# Patient Record
Sex: Male | Born: 1955 | Race: White | Hispanic: No | Smoking: Current every day smoker
Health system: Southern US, Community
[De-identification: ages and names within clinical notes are randomized; demographics above are authoritative.]

## PROBLEM LIST (undated history)

## (undated) DIAGNOSIS — I4891 Unspecified atrial fibrillation: Secondary | ICD-10-CM

## (undated) DIAGNOSIS — J449 Chronic obstructive pulmonary disease, unspecified: Secondary | ICD-10-CM

## (undated) DIAGNOSIS — K219 Gastro-esophageal reflux disease without esophagitis: Secondary | ICD-10-CM

## (undated) DIAGNOSIS — F32A Depression, unspecified: Secondary | ICD-10-CM

## (undated) DIAGNOSIS — M4800 Spinal stenosis, site unspecified: Secondary | ICD-10-CM

## (undated) DIAGNOSIS — I10 Essential (primary) hypertension: Secondary | ICD-10-CM

## (undated) DIAGNOSIS — I714 Abdominal aortic aneurysm, without rupture, unspecified: Secondary | ICD-10-CM

## (undated) DIAGNOSIS — D649 Anemia, unspecified: Secondary | ICD-10-CM

## (undated) HISTORY — DX: Depression, unspecified: F32.A

## (undated) HISTORY — DX: Gastro-esophageal reflux disease without esophagitis: K21.9

## (undated) HISTORY — DX: Essential (primary) hypertension: I10

## (undated) HISTORY — DX: Anemia, unspecified: D64.9

## (undated) HISTORY — DX: Chronic obstructive pulmonary disease, unspecified: J44.9

---

## 2019-02-19 DIAGNOSIS — I719 Aortic aneurysm of unspecified site, without rupture: Secondary | ICD-10-CM | POA: Insufficient documentation

## 2019-04-21 DIAGNOSIS — F429 Obsessive-compulsive disorder, unspecified: Secondary | ICD-10-CM | POA: Insufficient documentation

## 2019-04-21 DIAGNOSIS — I482 Chronic atrial fibrillation, unspecified: Secondary | ICD-10-CM | POA: Insufficient documentation

## 2019-04-21 DIAGNOSIS — F1011 Alcohol abuse, in remission: Secondary | ICD-10-CM | POA: Insufficient documentation

## 2019-04-21 DIAGNOSIS — N138 Other obstructive and reflux uropathy: Secondary | ICD-10-CM | POA: Insufficient documentation

## 2019-04-21 DIAGNOSIS — K709 Alcoholic liver disease, unspecified: Secondary | ICD-10-CM | POA: Insufficient documentation

## 2019-04-21 DIAGNOSIS — W19XXXA Unspecified fall, initial encounter: Secondary | ICD-10-CM | POA: Insufficient documentation

## 2019-04-21 DIAGNOSIS — R7989 Other specified abnormal findings of blood chemistry: Secondary | ICD-10-CM | POA: Insufficient documentation

## 2019-04-21 DIAGNOSIS — I10 Essential (primary) hypertension: Secondary | ICD-10-CM | POA: Insufficient documentation

## 2019-04-21 DIAGNOSIS — E782 Mixed hyperlipidemia: Secondary | ICD-10-CM | POA: Insufficient documentation

## 2019-04-21 DIAGNOSIS — F1010 Alcohol abuse, uncomplicated: Secondary | ICD-10-CM | POA: Insufficient documentation

## 2020-09-27 DIAGNOSIS — F4310 Post-traumatic stress disorder, unspecified: Secondary | ICD-10-CM | POA: Insufficient documentation

## 2020-11-14 DIAGNOSIS — E785 Hyperlipidemia, unspecified: Secondary | ICD-10-CM | POA: Insufficient documentation

## 2020-11-14 DIAGNOSIS — F17200 Nicotine dependence, unspecified, uncomplicated: Secondary | ICD-10-CM | POA: Insufficient documentation

## 2020-11-14 DIAGNOSIS — I714 Abdominal aortic aneurysm, without rupture, unspecified: Secondary | ICD-10-CM | POA: Insufficient documentation

## 2020-11-14 DIAGNOSIS — F1020 Alcohol dependence, uncomplicated: Secondary | ICD-10-CM | POA: Insufficient documentation

## 2020-11-15 DIAGNOSIS — I472 Ventricular tachycardia, unspecified: Secondary | ICD-10-CM | POA: Insufficient documentation

## 2020-11-15 DIAGNOSIS — F32A Depression, unspecified: Secondary | ICD-10-CM | POA: Insufficient documentation

## 2020-11-18 DIAGNOSIS — G4733 Obstructive sleep apnea (adult) (pediatric): Secondary | ICD-10-CM | POA: Insufficient documentation

## 2020-11-24 DIAGNOSIS — I25119 Atherosclerotic heart disease of native coronary artery with unspecified angina pectoris: Secondary | ICD-10-CM | POA: Insufficient documentation

## 2020-11-25 DIAGNOSIS — R410 Disorientation, unspecified: Secondary | ICD-10-CM | POA: Insufficient documentation

## 2020-11-25 DIAGNOSIS — B182 Chronic viral hepatitis C: Secondary | ICD-10-CM | POA: Insufficient documentation

## 2020-12-15 DIAGNOSIS — F329 Major depressive disorder, single episode, unspecified: Secondary | ICD-10-CM | POA: Insufficient documentation

## 2020-12-15 DIAGNOSIS — F3130 Bipolar disorder, current episode depressed, mild or moderate severity, unspecified: Secondary | ICD-10-CM | POA: Insufficient documentation

## 2020-12-15 DIAGNOSIS — E119 Type 2 diabetes mellitus without complications: Secondary | ICD-10-CM | POA: Insufficient documentation

## 2020-12-16 DIAGNOSIS — M79671 Pain in right foot: Secondary | ICD-10-CM | POA: Insufficient documentation

## 2020-12-16 DIAGNOSIS — D696 Thrombocytopenia, unspecified: Secondary | ICD-10-CM | POA: Insufficient documentation

## 2021-01-09 DIAGNOSIS — Z8673 Personal history of transient ischemic attack (TIA), and cerebral infarction without residual deficits: Secondary | ICD-10-CM | POA: Insufficient documentation

## 2021-01-09 DIAGNOSIS — Z993 Dependence on wheelchair: Secondary | ICD-10-CM | POA: Insufficient documentation

## 2021-01-29 DIAGNOSIS — Z720 Tobacco use: Secondary | ICD-10-CM | POA: Insufficient documentation

## 2021-02-03 DIAGNOSIS — G8929 Other chronic pain: Secondary | ICD-10-CM | POA: Insufficient documentation

## 2021-03-04 DIAGNOSIS — E7439 Other disorders of intestinal carbohydrate absorption: Secondary | ICD-10-CM | POA: Insufficient documentation

## 2021-03-04 DIAGNOSIS — I4811 Longstanding persistent atrial fibrillation: Secondary | ICD-10-CM | POA: Insufficient documentation

## 2021-03-04 DIAGNOSIS — D649 Anemia, unspecified: Secondary | ICD-10-CM | POA: Insufficient documentation

## 2021-03-04 DIAGNOSIS — M4802 Spinal stenosis, cervical region: Secondary | ICD-10-CM | POA: Insufficient documentation

## 2021-03-04 DIAGNOSIS — Z7901 Long term (current) use of anticoagulants: Secondary | ICD-10-CM | POA: Insufficient documentation

## 2021-03-13 ENCOUNTER — Other Ambulatory Visit: Payer: Self-pay

## 2021-03-13 ENCOUNTER — Emergency Department
Admission: EM | Admit: 2021-03-13 | Discharge: 2021-03-13 | Disposition: A | Discharge status: Skilled Nursing Facility | Payer: Medicare HMO | Attending: Emergency Medicine | Admitting: Emergency Medicine

## 2021-03-13 DIAGNOSIS — M542 Cervicalgia: Secondary | ICD-10-CM | POA: Insufficient documentation

## 2021-03-13 DIAGNOSIS — Z7901 Long term (current) use of anticoagulants: Secondary | ICD-10-CM | POA: Diagnosis not present

## 2021-03-13 DIAGNOSIS — G8929 Other chronic pain: Secondary | ICD-10-CM | POA: Insufficient documentation

## 2021-03-13 MED ORDER — BUPRENORPHINE HCL-NALOXONE HCL 2-0.5 MG SL SUBL
1.0000 | SUBLINGUAL_TABLET | Freq: Once | SUBLINGUAL | Status: AC
  Administered 2021-03-13: 1 via SUBLINGUAL
  Filled 2021-03-13: qty 1

## 2021-03-13 NOTE — ED Triage Notes (Signed)
Pt to ED from [facility] for chronic pain, pt states he takes suboxone and nobody would give it to him. Pt states pain "everywhere"

## 2021-03-13 NOTE — ED Notes (Signed)
See triage note  Presents via EMS for [facility]   Per staff he is here for pain control  He was admitted to [facility] yesterday afternoon  The staff is not allow to give suboxone

## 2021-03-13 NOTE — ED Triage Notes (Signed)
Pt comes into the ED via [facility] from [facility] c/o chronic pain.  Pt states the pain is mainly in the neck and shoulders.  Pt is worsened over the past couple weeks.  Pt was on suboxone.  90/59, 107 HR, 98.1 oral

## 2021-03-13 NOTE — ED Provider Notes (Signed)
[facility] EMERGENCY DEPARTMENT Provider Note   CSN: [id number] Arrival date & time: 03/13/21  1529     History Chief Complaint  Patient presents with   Pain Management    Adrian Randall is a 65 y.o. male presents to the emergency department for evaluation of pain management.  Patient has been a long stent and a hospital, 55 days for suicidal ideations, chest pain.  He underwent stress testing.  He had chronic neck pain and was found to have severe cervical stenosis and recommended to follow-up outpatient with neurosurgery.  He recently discharged to [facility].  Discharge medications consist of Suboxone twice daily which is he has been taking daily for chronic pain.  Facility states they are unable to give him this medication so he was sent to the ER to receive this medication.  Patient's Suboxone has been prescribed long-term for patient's chronic pain.  He is currently taking gabapentin.  Patient denies any chest pain, shortness of breath, suicidal ideations.  He is having no complaints with urination and bowel movements.  HPI     History reviewed. No pertinent past medical history.  There are no problems to display for this patient.      No family history on file.     Home Medications Prior to Admission medications   Medication Sig Start Date End Date Taking? Authorizing Provider  apixaban (ELIQUIS) 5 MG TABS tablet Take 5 mg by mouth 2 (two) times daily.   Yes [provider]  atorvastatin (LIPITOR) 40 MG tablet Take 40 mg by mouth daily.   Yes [provider]  buprenorphine-naloxone (SUBOXONE) 2-0.5 mg SUBL SL tablet Place 1 tablet under the tongue daily.   Yes [provider]  cyclobenzaprine (FLEXERIL) 5 MG tablet Take 5 mg by mouth 3 (three) times daily as needed for muscle spasms.   Yes [provider]  divalproex (DEPAKOTE ER) 500 MG 24 hr tablet Take 1,000 mg by mouth daily.   Yes  [provider]  FLUoxetine (PROZAC) 20 MG tablet Take 20 mg by mouth daily.   Yes [provider]  gabapentin (NEURONTIN) 300 MG capsule Take 300 mg by mouth at bedtime.   Yes [provider]  levothyroxine (SYNTHROID) 25 MCG tablet Take 25 mcg by mouth daily before breakfast.   Yes [provider]  metoprolol succinate (TOPROL-XL) 25 MG 24 hr tablet Take 25 mg by mouth daily.   Yes [provider]  OLANZapine (ZYPREXA) 2.5 MG tablet Take 2.5 mg by mouth at bedtime.   Yes [provider]  pantoprazole (PROTONIX) 40 MG tablet Take 40 mg by mouth daily.   Yes [provider]    Allergies    Patient has no allergy information on record.  Review of Systems   Review of Systems  Constitutional:  Negative for fever.  Respiratory:  Negative for shortness of breath.   Cardiovascular:  Negative for chest pain.  Gastrointestinal:  Negative for nausea and vomiting.  Musculoskeletal:  Positive for neck pain.  Skin:  Negative for rash and wound.  Neurological:  Negative for headaches.   Physical Exam Updated Vital Signs BP (!) 114/55 (BP Location: Right Arm)   Pulse 90   Temp 98.4 F (36.9 C) (Oral)   Resp 20   Ht 5\' 11"  (1.803 m)   Wt 77.1 kg   SpO2 96%   BMI 23.71 kg/m   Physical Exam Constitutional:      Appearance: He  is well-developed. He is obese.  HENT:     Head: Normocephalic and atraumatic.  Eyes:     Conjunctiva/sclera: Conjunctivae normal.  Cardiovascular:     Rate and Rhythm: Normal rate.  Pulmonary:     Effort: Pulmonary effort is normal. No respiratory distress.  Abdominal:     General: There is no distension.     Tenderness: There is no abdominal tenderness. There is no right CVA tenderness or guarding.  Musculoskeletal:        General: No swelling or tenderness. Normal range of motion.     Cervical back: Normal range of motion.  Skin:    General: Skin is warm.     Findings: No rash.  Neurological:      Mental Status: He is alert and oriented to person, place, and time.  Psychiatric:        Behavior: Behavior normal.        Thought Content: Thought content normal.    ED Results / Procedures / Treatments   Labs (all labs ordered are listed, but only abnormal results are displayed) Labs Reviewed - No data to display  EKG None  Radiology No results found.  Procedures Procedures   Medications Ordered in ED Medications  buprenorphine-naloxone (SUBOXONE) 2-0.5 mg per SL tablet 1 tablet (1 tablet Sublingual Given 03/13/21 1855)    ED Course  I have reviewed the triage vital signs and the nursing notes.  Pertinent labs & imaging results that were available during my care of the patient were reviewed by me and considered in my medical decision making (see chart for details).    MDM Rules/Calculators/A&P                         {  65 year old male with history of chronic pain presents from [facility].  Patient has Suboxone on his medication list and they are unable to provide him this medication.  Dose of Suboxone was given today.  No withdrawal symptoms.  Pain well controlled with medication.  Recommend facility contact prescribing physician of the Suboxone discussed alternative treatment for patient if they are unable to continue with this medication.  Patient stable and ready for discharge to skilled nursing facility. Final Clinical Impression(s) / ED Diagnoses Final diagnoses:  Encounter for chronic pain management    Rx / DC Orders ED Discharge Orders     None        Ronnette Juniper 03/13/21 1931    Sharyn Creamer, MD 03/13/21 2131

## 2021-03-13 NOTE — ED Notes (Signed)
Pt states he has not eaten since breakfast, pt given sandwich tray and soda.

## 2021-03-13 NOTE — ED Notes (Signed)
Verbal consent form pt for discharge, to [facility]

## 2021-03-13 NOTE — ED Notes (Signed)
Called [facility] for transport back to [facility]

## 2021-03-14 ENCOUNTER — Emergency Department
Admission: EM | Admit: 2021-03-14 | Discharge: 2021-03-14 | Disposition: A | Discharge status: Skilled Nursing Facility | Payer: Medicare HMO | Attending: Student in an Organized Health Care Education/Training Program | Admitting: Student in an Organized Health Care Education/Training Program

## 2021-03-14 ENCOUNTER — Encounter: Payer: Self-pay | Admitting: Emergency Medicine

## 2021-03-14 DIAGNOSIS — Z0189 Encounter for other specified special examinations: Secondary | ICD-10-CM | POA: Insufficient documentation

## 2021-03-14 DIAGNOSIS — G8929 Other chronic pain: Secondary | ICD-10-CM | POA: Insufficient documentation

## 2021-03-14 DIAGNOSIS — I4891 Unspecified atrial fibrillation: Secondary | ICD-10-CM | POA: Diagnosis not present

## 2021-03-14 DIAGNOSIS — Z7901 Long term (current) use of anticoagulants: Secondary | ICD-10-CM | POA: Diagnosis not present

## 2021-03-14 DIAGNOSIS — Z79899 Other long term (current) drug therapy: Secondary | ICD-10-CM | POA: Diagnosis not present

## 2021-03-14 DIAGNOSIS — F17210 Nicotine dependence, cigarettes, uncomplicated: Secondary | ICD-10-CM | POA: Insufficient documentation

## 2021-03-14 DIAGNOSIS — M545 Low back pain, unspecified: Secondary | ICD-10-CM | POA: Diagnosis present

## 2021-03-14 HISTORY — DX: Spinal stenosis, site unspecified: M48.00

## 2021-03-14 HISTORY — DX: Unspecified atrial fibrillation: I48.91

## 2021-03-14 HISTORY — DX: Abdominal aortic aneurysm, without rupture, unspecified: I71.40

## 2021-03-14 HISTORY — DX: Abdominal aortic aneurysm, without rupture: I71.4

## 2021-03-14 MED ORDER — BUPRENORPHINE HCL-NALOXONE HCL 2-0.5 MG SL SUBL: 1.0000 | SUBLINGUAL_TABLET | Freq: Two times a day (BID) | SUBLINGUAL | 0 refills | Status: AC

## 2021-03-14 MED ORDER — BUPRENORPHINE HCL-NALOXONE HCL 2-0.5 MG SL SUBL
1.0000 | SUBLINGUAL_TABLET | Freq: Once | SUBLINGUAL | Status: AC
  Administered 2021-03-14: 1 via SUBLINGUAL
  Filled 2021-03-14: qty 1

## 2021-03-14 NOTE — ED Notes (Signed)
[facility] called back to let staff know the outside vendor has his wheelchair Zenaida Niece in the shop and they have no way of getting him back to [facility]

## 2021-03-14 NOTE — ED Notes (Addendum)
Spoke with safe transport. Reports they will be calling outside vendor for transport and will call back to confirm ride and time.

## 2021-03-14 NOTE — ED Provider Notes (Addendum)
[facility] Emergency Department Provider Note ____________________________________________  Time seen: 1319  I have reviewed the triage vital signs and the nursing notes.  HISTORY  Chief Complaint  Pain Management   HPI Adrian Randall is a 65 y.o. male to the ED via EMS with subsequent request for pain management.  Patient is currently a resident at [facility], for rehab.  He has a history of chronic back pain including spinal stenosis.  He was recently evaluated by provider at [facility], and surgery was requested.  They recommended rehab therapy for strengthening and muscle conditioning, prior to surgery.  Patient has been under the care of a pain management clinic and he has been on Suboxone for years.  He apparently presents to the ED for the second consecutive day, for administration of Suboxone.  While the patient has a current prescription, there are providers and or nursing staff at [facility], are unable to dispense the controlled substance.  He denies any recent injury, trauma, or falls.  He also denies any increased anxiety, chest pain, shortness breath, nausea, vomiting, or any other signs of withdrawal.  Past Medical History:  Diagnosis Date   A-fib (HCC)    AAA (abdominal aortic aneurysm) (HCC)    Spinal stenosis     There are no problems to display for this patient.   Prior to Admission medications   Medication Sig Start Date End Date Taking? Authorizing Provider  buprenorphine-naloxone (SUBOXONE) 2-0.5 mg SUBL SL tablet Place 1 tablet under the tongue every 12 (twelve) hours for 3 days. 03/14/21 03/17/21 Yes Argelia Formisano, Charlesetta Ivory, PA-C  apixaban (ELIQUIS) 5 MG TABS tablet Take 5 mg by mouth 2 (two) times daily.    [provider]  atorvastatin (LIPITOR) 40 MG tablet Take 40 mg by mouth daily.    [provider]  buprenorphine-naloxone (SUBOXONE) 2-0.5 mg SUBL SL tablet Place 1 tablet under the tongue daily.     [provider]  cyclobenzaprine (FLEXERIL) 5 MG tablet Take 5 mg by mouth 3 (three) times daily as needed for muscle spasms.    [provider]  divalproex (DEPAKOTE ER) 500 MG 24 hr tablet Take 1,000 mg by mouth daily.    [provider]  FLUoxetine (PROZAC) 20 MG tablet Take 20 mg by mouth daily.    [provider]  gabapentin (NEURONTIN) 300 MG capsule Take 300 mg by mouth at bedtime.    [provider]  levothyroxine (SYNTHROID) 25 MCG tablet Take 25 mcg by mouth daily before breakfast.    [provider]  metoprolol succinate (TOPROL-XL) 25 MG 24 hr tablet Take 25 mg by mouth daily.    [provider]  OLANZapine (ZYPREXA) 2.5 MG tablet Take 2.5 mg by mouth at bedtime.    [provider]  pantoprazole (PROTONIX) 40 MG tablet Take 40 mg by mouth daily.    [provider]    Allergies Patient has no known allergies.  No family history on file.  Social History Social History   Tobacco Use   Smoking status: Every Day    Types: Cigarettes    Review of Systems  Constitutional: Negative for fever. Eyes: Negative for visual changes. ENT: Negative for sore throat. Cardiovascular: Negative for chest pain. Respiratory: Negative for shortness of breath. Gastrointestinal: Negative for abdominal pain, vomiting and diarrhea. Genitourinary: Negative for dysuria. Musculoskeletal: Positive for chronic back pain. Skin: Negative for rash. Neurological: Negative for headaches, focal weakness or numbness. ____________________________________________  PHYSICAL EXAM:  VITAL SIGNS: ED Triage Vitals  Enc Vitals Group     BP 03/14/21 1314 (!) 122/59     Pulse Rate 03/14/21 1314 70     Resp 03/14/21 1314 16     Temp 03/14/21 1314 98 F (36.7 C)     Temp Source 03/14/21 1314 Oral     SpO2 03/14/21 1314 98 %     Weight 03/14/21 1228 169 lb 15.6 oz (77.1 kg)     Height 03/14/21 1228 5\' 11"  (1.803 m)     Head  Circumference --      Peak Flow --      Pain Score 03/14/21 1228 7     Pain Loc --      Pain Edu? --      Excl. in GC? --     Constitutional: Alert and oriented. Well appearing and in no distress. Head: Normocephalic and atraumatic. Eyes: Conjunctivae are normal. PER. Normal extraocular movements Cardiovascular: Normal rate, regular rhythm. Normal distal pulses. Respiratory: Normal respiratory effort. No wheezes/rales/rhonchi. Gastrointestinal: Soft and nontender. No distention. Musculoskeletal: Nontender with normal range of motion in all extremities.  Neurologic:  Normal speech and language. No gross focal neurologic deficits are appreciated. Skin:  Skin is warm, dry and intact. No rash noted. Psychiatric: Mood and affect are normal. Patient exhibits appropriate insight and judgment. ____________________________________________    {LABS (pertinent positives/negatives)  ____________________________________________  {EKG  ____________________________________________   RADIOLOGY Official radiology report(s): No results found. ____________________________________________  PROCEDURES  Buprenorphine-naloxone 2-0.5 mg SL  Procedures ____________________________________________   INITIAL IMPRESSION / ASSESSMENT AND PLAN / ED COURSE  As part of my medical decision making, I reviewed the following data within the electronic MEDICAL RECORD NUMBER Notes from prior ED visits and [place] Controlled Substance Database    Patient with subsequent ED evaluation of chronic back pain.  He presents to the ED for the second consecutive day, for administration of his prescribed Suboxone.  Patient denies any recent injury or signs of withdrawal.  Orders placed in medication as provided in the ED.  Patient will be transferred back to his facility via EMS.   [name] was evaluated in Emergency Department on 03/14/2021 for the symptoms described in the history of present illness. He was evaluated  in the context of the global COVID-19 pandemic, which necessitated consideration that the patient might be at risk for infection with the SARS-CoV-2 virus that causes COVID-19. Institutional protocols and algorithms that pertain to the evaluation of patients at risk for COVID-19 are in a state of rapid change based on information released by regulatory bodies including the CDC and federal and state organizations. These policies and algorithms were followed during the patient's care in the ED.  ----------------------------------------- 6:14 PM on 03/14/2021 ----------------------------------------- I spoke with 05/14/2021, RN Manager at [facility]: She reports that their pharmacy facility is available to feel a valid prescription for the patient's Suboxone.  A however, do not have house MD who is authorized to write prescription as of this weekend.  They have made contact with the most recent provider out of [employer] who wrote the prescription last week, he is available to write prescription, but will not be available until Monday.  The patient needs a valid prescription for Suboxone for this weekend, that can be dispensed by the facility pharmacy.  I spoke with Friday, Pharm.Paulino Door [facility]) and she confirmed that Suboxone can be written by any authorized prescriber, as long as it is for pain  management, and not for opioid use disorder or opiate withdrawal.  Patient is information with Ms. Henegan, who was appreciative of the information.  She reports however, her provider is not available to write the prescription for this weekend.  I did write a prescription for #6 Suboxone 2-0.5 mg subcu tab to bridge the patient until he is able to get the prescription from the provider on Monday.  Patient subsequently transported nonemergency back to his facility in no acute distress.  I reviewed the patient's prescription history over the last 12 months in the multi-state controlled  substances database(s) that includes [place], [place], [place], [place], [place], [place], [place], [place], [place], [place], [place], [place], [place], and [place].  Results were notable for recent RX noted.  ____________________________________________  FINAL CLINICAL IMPRESSION(S) / ED DIAGNOSES  Final diagnoses:  Encounter for chronic pain management      Karmen Stabs, Charlesetta Ivory, PA-C 03/14/21 1507    Willy Eddy, MD 03/14/21 [address] Charlesetta Ivory, PA-C 03/14/21 1818    Willy Eddy, MD 03/15/21 1501

## 2021-03-14 NOTE — ED Notes (Signed)
Donnella Bi with EMS called to report they will not be picking up patient to transport back to [facility]. [facility] transport will be called

## 2021-03-14 NOTE — ED Triage Notes (Signed)
First Nurse Note:  Arrives via [facility] from [facility] for management of pain. Seen through ED yesterday for same.

## 2021-03-14 NOTE — Discharge Instructions (Addendum)
Follow-up with your primary care provider for ongoing management.  See your neurologist for surgical intervention.

## 2021-03-25 ENCOUNTER — Emergency Department
Admission: EM | Admit: 2021-03-25 | Discharge: 2021-03-25 | Disposition: A | Discharge status: Home / Self Care | Payer: Medicare HMO | Attending: Emergency Medicine | Admitting: Emergency Medicine

## 2021-03-25 ENCOUNTER — Encounter: Payer: Self-pay | Admitting: Emergency Medicine

## 2021-03-25 ENCOUNTER — Other Ambulatory Visit: Payer: Self-pay

## 2021-03-25 DIAGNOSIS — I959 Hypotension, unspecified: Secondary | ICD-10-CM | POA: Diagnosis present

## 2021-03-25 DIAGNOSIS — I4891 Unspecified atrial fibrillation: Secondary | ICD-10-CM | POA: Insufficient documentation

## 2021-03-25 DIAGNOSIS — F17210 Nicotine dependence, cigarettes, uncomplicated: Secondary | ICD-10-CM | POA: Insufficient documentation

## 2021-03-25 DIAGNOSIS — Z7901 Long term (current) use of anticoagulants: Secondary | ICD-10-CM | POA: Diagnosis not present

## 2021-03-25 DIAGNOSIS — Z79899 Other long term (current) drug therapy: Secondary | ICD-10-CM

## 2021-03-25 LAB — BASIC METABOLIC PANEL
Anion gap: 9 (ref 5–15)
BUN: 18 mg/dL (ref 8–23)
CO2: 26 mmol/L (ref 22–32)
Calcium: 8.7 mg/dL — ABNORMAL LOW (ref 8.9–10.3)
Chloride: 101 mmol/L (ref 98–111)
Creatinine, Ser: 0.78 mg/dL (ref 0.61–1.24)
GFR, Estimated: >60 mL/min (ref >60)
Glucose, Bld: 106 mg/dL — ABNORMAL HIGH (ref 70–99)
Potassium: 4.1 mmol/L (ref 3.5–5.1)
Sodium: 136 mmol/L (ref 135–145)

## 2021-03-25 LAB — CBC
HCT: 38.2 % — ABNORMAL LOW (ref 39.0–52.0)
Hemoglobin: 13.1 g/dL (ref 13.0–17.0)
MCH: 30.7 pg (ref 26.0–34.0)
MCHC: 34.3 g/dL (ref 30.0–36.0)
MCV: 89.5 fL (ref 80.0–100.0)
Platelets: 192 10*3/uL (ref 150–400)
RBC: 4.27 MIL/uL (ref 4.22–5.81)
RDW: 13.8 % (ref 11.5–15.5)
WBC: 6.1 10*3/uL (ref 4.0–10.5)
nRBC: 0 % (ref 0.0–0.2)

## 2021-03-25 NOTE — ED Triage Notes (Signed)
Pt called from [facility] to treatment room, no response 

## 2021-03-25 NOTE — ED Triage Notes (Signed)
Pt from [facility] care with c/o low O2 and hypotension. Pt is unable to tell me why they sent him here.

## 2021-03-25 NOTE — ED Provider Notes (Signed)
[facility] Emergency Department Provider Note   ____________________________________________   Event Date/Time   First MD Initiated Contact with Patient 03/25/21 1550     (approximate)  I have reviewed the triage vital signs and the nursing notes.   HISTORY  Chief Complaint Hypotension    HPI Adrian Randall is a 65 y.o. male who presents from his long-term care facility after they found him to be hypotensive and hypoxic.  Patient denies any complaints at this time and states that he does not know why he is here.  Patient does state that he has had multiple changes made to his medications including his medications for chronic pain recently.  Patient also states that when he sits forward sometimes he has lightheadedness but this is nothing new for him.  Patient states that the symptoms have been present for the last 2-3 months patient currently denies any vision changes, tinnitus, difficulty speaking, facial droop, sore throat, chest pain, shortness of breath, abdominal pain, nausea/vomiting/diarrhea, dysuria, or weakness/numbness/paresthesias in any extremity          Past Medical History:  Diagnosis Date   A-fib [facility])    AAA (abdominal aortic aneurysm) (HCC)    Spinal stenosis     There are no problems to display for this patient.   History reviewed. No pertinent surgical history.  Prior to Admission medications   Medication Sig Start Date End Date Taking? Authorizing Provider  apixaban (ELIQUIS) 5 MG TABS tablet Take 5 mg by mouth 2 (two) times daily.    [provider]  atorvastatin (LIPITOR) 40 MG tablet Take 40 mg by mouth daily.    [provider]  buprenorphine-naloxone (SUBOXONE) 2-0.5 mg SUBL SL tablet Place 1 tablet under the tongue daily.    [provider]  cyclobenzaprine (FLEXERIL) 5 MG tablet Take 5 mg by mouth 3 (three) times daily as needed for muscle spasms.    [provider]  divalproex  (DEPAKOTE ER) 500 MG 24 hr tablet Take 1,000 mg by mouth daily.    [provider]  FLUoxetine (PROZAC) 20 MG tablet Take 20 mg by mouth daily.    [provider]  gabapentin (NEURONTIN) 300 MG capsule Take 300 mg by mouth at bedtime.    [provider]  levothyroxine (SYNTHROID) 25 MCG tablet Take 25 mcg by mouth daily before breakfast.    [provider]  metoprolol succinate (TOPROL-XL) 25 MG 24 hr tablet Take 25 mg by mouth daily.    [provider]  OLANZapine (ZYPREXA) 2.5 MG tablet Take 2.5 mg by mouth at bedtime.    [provider]  pantoprazole (PROTONIX) 40 MG tablet Take 40 mg by mouth daily.    [provider]    Allergies Patient has no known allergies.  No family history on file.  Social History Social History   Tobacco Use   Smoking status: Every Day    Types: Cigarettes    Review of Systems Constitutional: No fever/chills Eyes: No visual changes. ENT: No sore throat. Cardiovascular: Denies chest pain. Respiratory: Denies shortness of breath. Gastrointestinal: No abdominal pain.  No nausea, no vomiting.  No diarrhea. Genitourinary: Negative for dysuria. Musculoskeletal: Negative for acute arthralgias Skin: Negative for rash. Neurological: Negative for headaches, weakness/numbness/paresthesias in any extremity Psychiatric: Negative for suicidal ideation/homicidal ideation   ____________________________________________   PHYSICAL EXAM:  VITAL SIGNS: ED Triage Vitals  Enc Vitals Group     BP 03/25/21 1335 101/72  Pulse --      Resp --      Temp 03/25/21 1335 97.7 F (36.5 C)     Temp Source 03/25/21 1335 Oral     SpO2 03/25/21 1335 96 %     Weight 03/25/21 1336 169 lb 15.6 oz (77.1 kg)     Height 03/25/21 1336 5\' 11"  (1.803 m)     Head Circumference --      Peak Flow --      Pain Score 03/25/21 1336 0     Pain Loc --      Pain Edu? --      Excl. in GC? --    Constitutional: Alert  and oriented. Well appearing elderly Caucasian male in no acute distress. Eyes: Conjunctivae are normal. PERRL. Head: Atraumatic. Nose: No congestion/rhinnorhea. Mouth/Throat: Mucous membranes are moist. Neck: No stridor Cardiovascular: Grossly normal heart sounds.  Good peripheral circulation. Respiratory: Normal respiratory effort.  No retractions. Gastrointestinal: Soft and nontender. No distention. Musculoskeletal: No obvious deformities Neurologic:  Normal speech and language. No gross focal neurologic deficits are appreciated. Skin:  Skin is warm and dry. No rash noted. Psychiatric: Mood and affect are normal. Speech and behavior are normal.  ____________________________________________   LABS (all labs ordered are listed, but only abnormal results are displayed)  Labs Reviewed  BASIC METABOLIC PANEL - Abnormal; Notable for the following components:      Result Value   Glucose, Bld 106 (*)    Calcium 8.7 (*)    All other components within normal limits  CBC - Abnormal; Notable for the following components:   HCT 38.2 (*)    All other components within normal limits   ____________________________________________  EKG  ED ECG REPORT I, 03/27/21, the attending physician, personally viewed and interpreted this ECG.  Date: 03/25/2021 EKG Time: 1345 Rate: 108 Rhythm: Atrial fibrillation QRS Axis: normal Intervals: normal ST/T Wave abnormalities: normal Narrative Interpretation: Atrial fibrillation.  No evidence of acute ischemia   PROCEDURES  Procedure(s) performed (including Critical Care):  .1-3 Lead EKG Interpretation  Date/Time: 03/25/2021 4:13 PM Performed by: 03/27/2021, MD Authorized by: Merwyn Katos, MD     Interpretation: abnormal     ECG rate:  98   ECG rate assessment: normal     Rhythm: atrial fibrillation     Ectopy: none     Conduction: normal     ____________________________________________   INITIAL IMPRESSION /  ASSESSMENT AND PLAN / ED COURSE  As part of my medical decision making, I reviewed the following data within the electronic medical record, if available:  Nursing notes reviewed and incorporated, Labs reviewed, EKG interpreted, Old chart reviewed, Radiograph reviewed and Notes from prior ED visits reviewed and incorporated      Patient presents for transient hypotension and hypoxia No chest pain, shortness of breath or significant hypoxia, head/neck/back trauma, history of heart failure or cardiomyopathy. Unlikely aortic dissection, MI induced, massive pulmonary embolism, neurogenic shock, cardiogenic shock. No recent melena, BRBPR, hematemesis, emesis, PO intolerance. No fever, cough, dysuria Unlikely hypovolemia, septic shock. Workup: CBC, CMP, PT/INR, Lactate, Type and Screen, BNP. Interventions: Fluid resuscitation.  Results: No evidence of red flag symptomatology or laboratory results Very possibly related to patient's multiple pain medications as well as beta-blockade  Will encourage patient to follow-up with her primary care provider and prescriber of these medications to thoroughly review and likely change some of these medications if this hypotension recurs. The patient has been reexamined and is  ready to be discharged.  All diagnostic results have been reviewed and discussed with the patient/family.  Care plan has been outlined and the patient/family understands all current diagnoses, results, and treatment plans.  There are no new complaints, changes, or physical findings at this time.  All questions have been addressed and answered.  Patient was instructed to, and agrees to follow-up with their primary care physician as well as return to the emergency department if any new or worsening symptoms develop.     ____________________________________________   FINAL CLINICAL IMPRESSION(S) / ED DIAGNOSES  Final diagnoses:  Transient hypotension  Polypharmacy     ED Discharge  Orders     None        Note:  This document was prepared using Dragon voice recognition software and may include unintentional dictation errors.    Merwyn Katos, MD 03/25/21 [phone]

## 2021-08-19 ENCOUNTER — Emergency Department: Payer: Medicare HMO

## 2021-08-19 ENCOUNTER — Other Ambulatory Visit: Payer: Self-pay

## 2021-08-19 ENCOUNTER — Emergency Department
Admission: EM | Admit: 2021-08-19 | Discharge: 2021-08-19 | Disposition: A | Discharge status: Home / Self Care | Payer: Medicare HMO | Attending: Emergency Medicine | Admitting: Emergency Medicine

## 2021-08-19 DIAGNOSIS — I4891 Unspecified atrial fibrillation: Secondary | ICD-10-CM | POA: Diagnosis not present

## 2021-08-19 DIAGNOSIS — Z7901 Long term (current) use of anticoagulants: Secondary | ICD-10-CM | POA: Insufficient documentation

## 2021-08-19 DIAGNOSIS — R4182 Altered mental status, unspecified: Secondary | ICD-10-CM | POA: Diagnosis present

## 2021-08-19 DIAGNOSIS — R55 Syncope and collapse: Secondary | ICD-10-CM | POA: Diagnosis not present

## 2021-08-19 DIAGNOSIS — G9389 Other specified disorders of brain: Secondary | ICD-10-CM | POA: Diagnosis not present

## 2021-08-19 LAB — PROTIME-INR
INR: 1.2 (ref 0.8–1.2)
Prothrombin Time: 15.2 seconds (ref 11.4–15.2)

## 2021-08-19 LAB — CBC
HCT: 40.2 % (ref 39.0–52.0)
Hemoglobin: 13.2 g/dL (ref 13.0–17.0)
MCH: 30.7 pg (ref 26.0–34.0)
MCHC: 32.8 g/dL (ref 30.0–36.0)
MCV: 93.5 fL (ref 80.0–100.0)
Platelets: 173 10*3/uL (ref 150–400)
RBC: 4.3 MIL/uL (ref 4.22–5.81)
RDW: 15.1 % (ref 11.5–15.5)
WBC: 10.4 10*3/uL (ref 4.0–10.5)
nRBC: 0 % (ref 0.0–0.2)

## 2021-08-19 LAB — COMPREHENSIVE METABOLIC PANEL
ALT: 12 U/L (ref 0–44)
AST: 18 U/L (ref 15–41)
Albumin: 3.4 g/dL — ABNORMAL LOW (ref 3.5–5.0)
Alkaline Phosphatase: 82 U/L (ref 38–126)
Anion gap: 9 (ref 5–15)
BUN: 16 mg/dL (ref 8–23)
CO2: 27 mmol/L (ref 22–32)
Calcium: 8.8 mg/dL — ABNORMAL LOW (ref 8.9–10.3)
Chloride: 98 mmol/L (ref 98–111)
Creatinine, Ser: 0.56 mg/dL — ABNORMAL LOW (ref 0.61–1.24)
GFR, Estimated: >60 mL/min (ref >60)
Glucose, Bld: 103 mg/dL — ABNORMAL HIGH (ref 70–99)
Potassium: 4.4 mmol/L (ref 3.5–5.1)
Sodium: 134 mmol/L — ABNORMAL LOW (ref 135–145)
Total Bilirubin: 0.6 mg/dL (ref 0.3–1.2)
Total Protein: 7 g/dL (ref 6.5–8.1)

## 2021-08-19 MED ORDER — SODIUM CHLORIDE 0.9 % IV BOLUS
1000.0000 mL | Freq: Once | INTRAVENOUS | Status: AC
  Administered 2021-08-19: 1000 mL via INTRAVENOUS

## 2021-08-19 NOTE — ED Notes (Signed)
[facility] called to transport pt back to [facility]

## 2021-08-19 NOTE — ED Provider Notes (Signed)
[facility] Provider Note    Event Date/Time   First MD Initiated Contact with Patient 08/19/21 1650     (approximate)   History   Altered Mental Status   HPI  Adrian Randall is a 66 y.o. male with past medical history of atrial fibrillation on Eliquis, chronic pain on methadone, who presents after an episode of loss of consciousness.  Patient initially tells me he is here to be evaluated for whether he would be a surgical candidate for knee replacement.  However per triage note, patient had an episode where his eyes rolled back and he had shaking at his facility.  Patient does recall that he was in the wheelchair when he felt lightheaded and then seem to pass out.  When he woke he felt back to baseline.  Denies any ongoing symptoms.  He denies chest pain shortness of breath lightheadedness abdominal pain nausea vomiting.  Currently he feels back to baseline.  Patient tells me that he was previously on Suboxone for his pain but about a week ago was changed to methadone.  He is on 20 mg daily.  Not feel that he has been oversedated.    Past Medical History:  Diagnosis Date   A-fib [facility])    AAA (abdominal aortic aneurysm) (HCC)    Spinal stenosis     There are no problems to display for this patient.    Physical Exam  Triage Vital Signs: ED Triage Vitals  Enc Vitals Group     BP 08/19/21 1307 129/82     Pulse Rate 08/19/21 1307 94     Resp 08/19/21 1307 20     Temp 08/19/21 1307 99.1 F (37.3 C)     Temp Source 08/19/21 1307 Oral     SpO2 08/19/21 1307 93 %     Weight 08/19/21 1312 169 lb 15.6 oz (77.1 kg)     Height 08/19/21 1312 5\' 11"  (1.803 m)     Head Circumference --      Peak Flow --      Pain Score 08/19/21 1312 0     Pain Loc --      Pain Edu? --      Excl. in GC? --     Most recent vital signs: Vitals:   08/19/21 1704 08/19/21 1835  BP: 90/63 106/75  Pulse: 96   Resp: 13   Temp:    SpO2: 94%      General: Awake, no  distress.  CV:  Good peripheral perfusion.  Resp:  Normal effort.  Abd:  No distention.  Neuro:             Awake, Alert, Oriented x 3  Aox3, nml speech  PERRL, EOMI, face symmetric, nml tongue movement  5/5 strength in the BL upper and lower extremities  Sensation grossly intact in the BL upper and lower extremities  Finger-nose-finger intact BL  Other:     ED Results / Procedures / Treatments  Labs (all labs ordered are listed, but only abnormal results are displayed) Labs Reviewed  COMPREHENSIVE METABOLIC PANEL - Abnormal; Notable for the following components:      Result Value   Sodium 134 (*)    Glucose, Bld 103 (*)    Creatinine, Ser 0.56 (*)    Calcium 8.8 (*)    Albumin 3.4 (*)    All other components within normal limits  CBC  PROTIME-INR  CBG MONITORING, ED     EKG  Atrial  fibrillation, rate controlled, normal intervals, no acute ischemic changes   RADIOLOGY CT head reviewed, mild increase in ventricle size which could be seen from normal aging versus normal pressure hydrocephalus, agree with radiology report   PROCEDURES:  Critical Care performed: No  .1-3 Lead EKG Interpretation Performed by: Georga Hacking, MD Authorized by: Georga Hacking, MD     Interpretation: normal     ECG rate assessment: normal     Rhythm: sinus rhythm     Ectopy: none     Conduction: normal    The patient is on the cardiac monitor to evaluate for evidence of arrhythmia and/or significant heart rate changes.   MEDICATIONS ORDERED IN ED: Medications  sodium chloride 0.9 % bolus 1,000 mL (1,000 mLs Intravenous New Bag/Given 08/19/21 1718)     IMPRESSION / MDM / ASSESSMENT AND PLAN / ED COURSE  I reviewed the triage vital signs and the nursing notes.                              Differential diagnosis includes, but is not limited to, syncope, seizure, arrhythmia, oversedation secondary to methadone Patient is a 66 year old male presenting with an episode of  loss of consciousness.  I reviewed the triage note which says that he had an episode where his eyes rolled back and he had some brief shaking while at his facility.  Patient initially telling me he is here to see whether he can be evaluated for knee surgery but then when questioned further does recall the event.  He was apparently in the wheelchair when he felt somewhat lightheaded and thinks he may have passed out.  He had no preceding chest pain or dyspnea.  Currently feels back to baseline denies current lightheadedness or dizziness.  His blood pressures are somewhat borderline but on review of his prior records does tend to run low.  Of note patient was recently changed from Suboxone to methadone.  On EKG he is in rate controlled A. fib which is normal for him.  No prolongation of the QT to suggest that this could have been from torsades related to the methadone.  CBC and BMP are reassuring.  CT head was obtained which shows no acute process.  Ventricle size is somewhat large radiology comments this is could be due to increased atrophy versus NPH.  Patient has no change in his gait or any memory difficulty, does have urinary incontinence though which he says has been going on for several months.  Clinically his presentation does not seem to be related to NPH.  Will refer to neurology.  Patient also has a history of ethanol abuse so I wonder whether he just has increased atrophy related to this.  He was given a liter of fluids.  Blood pressure remained stable he did not have any other episodes on the ED.  Given he is back to baseline without any complaints I feel he is stable for discharge.      FINAL CLINICAL IMPRESSION(S) / ED DIAGNOSES   Final diagnoses:  Syncope, unspecified syncope type  Cerebral ventriculomegaly     Rx / DC Orders   ED Discharge Orders     None        Note:  This document was prepared using Dragon voice recognition software and may include unintentional dictation  errors.   Georga Hacking, MD 08/19/21 [phone]

## 2021-08-19 NOTE — Discharge Instructions (Addendum)
You likely had a syncopal episode.  Your blood work was reassuring today.  On your CAT scan your ventricles were somewhat enlarged, this may be from aging or could be due to something called normal pressure hydrocephalus.  You may follow-up with a neurologist regarding this.

## 2021-08-19 NOTE — ED Triage Notes (Signed)
PT here via [facility] from [facility] with [facility]. BP at facility was 89/54, 116/64 with ems. Normally A&Ox4 but at 0800 this morning pt was found in his wheelchair behaving altered. Pt has periods where his eyes roll back in his head with a minor shake. Pt takes methadone, hx of etoh and a fib. Denies fall, on blood thinner. Pt in NAD in triage.  99-temp 163-cbg 94% 114/79 100 25 35-end tidal

## 2021-08-27 ENCOUNTER — Other Ambulatory Visit: Payer: Self-pay | Admitting: Neurology

## 2021-08-27 DIAGNOSIS — R4189 Other symptoms and signs involving cognitive functions and awareness: Secondary | ICD-10-CM

## 2021-08-27 DIAGNOSIS — R569 Unspecified convulsions: Secondary | ICD-10-CM

## 2021-08-27 NOTE — Progress Notes (Signed)
Referral to outpatient neurology placed for ED f/u.  Bing Neighbors, MD [facility] [phone]  If 7pm- 7am, please page neurology on call as listed in AMION.
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
## 2021-11-12 ENCOUNTER — Ambulatory Visit: Payer: Medicare HMO | Attending: Pain Medicine | Admitting: Pain Medicine

## 2021-11-12 ENCOUNTER — Encounter: Payer: Self-pay | Admitting: Pain Medicine

## 2021-11-12 VITALS — BP 108/52 | HR 108 | Temp 97.1°F | Resp 18 | Ht 71.0 in | Wt 270.0 lb

## 2021-11-12 DIAGNOSIS — M79604 Pain in right leg: Secondary | ICD-10-CM

## 2021-11-12 DIAGNOSIS — Z7901 Long term (current) use of anticoagulants: Secondary | ICD-10-CM | POA: Insufficient documentation

## 2021-11-12 DIAGNOSIS — M19011 Primary osteoarthritis, right shoulder: Secondary | ICD-10-CM | POA: Diagnosis present

## 2021-11-12 DIAGNOSIS — M19012 Primary osteoarthritis, left shoulder: Secondary | ICD-10-CM | POA: Insufficient documentation

## 2021-11-12 DIAGNOSIS — M5382 Other specified dorsopathies, cervical region: Secondary | ICD-10-CM | POA: Insufficient documentation

## 2021-11-12 DIAGNOSIS — M79605 Pain in left leg: Secondary | ICD-10-CM | POA: Insufficient documentation

## 2021-11-12 DIAGNOSIS — M5030 Other cervical disc degeneration, unspecified cervical region: Secondary | ICD-10-CM | POA: Diagnosis present

## 2021-11-12 DIAGNOSIS — M899 Disorder of bone, unspecified: Secondary | ICD-10-CM | POA: Diagnosis present

## 2021-11-12 DIAGNOSIS — M79602 Pain in left arm: Secondary | ICD-10-CM | POA: Diagnosis present

## 2021-11-12 DIAGNOSIS — M25562 Pain in left knee: Secondary | ICD-10-CM | POA: Insufficient documentation

## 2021-11-12 DIAGNOSIS — M25512 Pain in left shoulder: Secondary | ICD-10-CM | POA: Diagnosis present

## 2021-11-12 DIAGNOSIS — M542 Cervicalgia: Secondary | ICD-10-CM | POA: Insufficient documentation

## 2021-11-12 DIAGNOSIS — R937 Abnormal findings on diagnostic imaging of other parts of musculoskeletal system: Secondary | ICD-10-CM | POA: Insufficient documentation

## 2021-11-12 DIAGNOSIS — M25511 Pain in right shoulder: Secondary | ICD-10-CM | POA: Diagnosis present

## 2021-11-12 DIAGNOSIS — G894 Chronic pain syndrome: Secondary | ICD-10-CM | POA: Insufficient documentation

## 2021-11-12 DIAGNOSIS — Z789 Other specified health status: Secondary | ICD-10-CM | POA: Insufficient documentation

## 2021-11-12 DIAGNOSIS — M5442 Lumbago with sciatica, left side: Secondary | ICD-10-CM | POA: Diagnosis present

## 2021-11-12 DIAGNOSIS — M25611 Stiffness of right shoulder, not elsewhere classified: Secondary | ICD-10-CM | POA: Insufficient documentation

## 2021-11-12 DIAGNOSIS — M5441 Lumbago with sciatica, right side: Secondary | ICD-10-CM | POA: Diagnosis present

## 2021-11-12 DIAGNOSIS — G8929 Other chronic pain: Secondary | ICD-10-CM | POA: Diagnosis present

## 2021-11-12 DIAGNOSIS — M79601 Pain in right arm: Secondary | ICD-10-CM | POA: Diagnosis present

## 2021-11-12 DIAGNOSIS — Z79899 Other long term (current) drug therapy: Secondary | ICD-10-CM | POA: Diagnosis present

## 2021-11-12 DIAGNOSIS — R700 Elevated erythrocyte sedimentation rate: Secondary | ICD-10-CM | POA: Diagnosis present

## 2021-11-12 DIAGNOSIS — R7982 Elevated C-reactive protein (CRP): Secondary | ICD-10-CM | POA: Insufficient documentation

## 2021-11-12 DIAGNOSIS — M170 Bilateral primary osteoarthritis of knee: Secondary | ICD-10-CM | POA: Insufficient documentation

## 2021-11-12 DIAGNOSIS — M4802 Spinal stenosis, cervical region: Secondary | ICD-10-CM | POA: Diagnosis present

## 2021-11-12 DIAGNOSIS — M25561 Pain in right knee: Secondary | ICD-10-CM | POA: Insufficient documentation

## 2021-11-12 DIAGNOSIS — Z008 Encounter for other general examination: Secondary | ICD-10-CM | POA: Insufficient documentation

## 2021-11-12 NOTE — Progress Notes (Signed)
Safety precautions to be maintained throughout the outpatient stay will include: orient to surroundings, keep bed in low position, maintain call bell within reach at all times, provide assistance with transfer out of bed and ambulation.  

## 2021-11-12 NOTE — Patient Instructions (Addendum)
Get your x-rays done as soon as possible. ?Stop Eliquis 3 days before procedure. ?______________________________________________________________________ ? ?Preparing for your procedure (without sedation) ? ?Procedure appointments are limited to planned procedures: ?No Prescription Refills. ?No disability issues will be discussed. ?No medication changes will be discussed. ? ?Instructions: ?Oral Intake: Do not eat or drink anything for at least 6 hours prior to your procedure. (Exception: Blood Pressure Medication. See below.) ?Transportation: Unless otherwise stated by your physician, you may drive yourself after the procedure. ?Blood Pressure Medicine: Do not forget to take your blood pressure medicine with a sip of water the morning of the procedure. If your Diastolic (lower reading)is above 100 mmHg, elective cases will be cancelled/rescheduled. ?Blood thinners: These will need to be stopped for procedures. Notify our staff if you are taking any blood thinners. Depending on which one you take, there will be specific instructions on how and when to stop it. ?Diabetics on insulin: Notify the staff so that you can be scheduled 1st case in the morning. If your diabetes requires high dose insulin, take only ? of your normal insulin dose the morning of the procedure and notify the staff that you have done so. ?Preventing infections: Shower with an antibacterial soap the morning of your procedure.  ?Build-up your immune system: Take 1000 mg of Vitamin C with every meal (3 times a day) the day prior to your procedure. ?Antibiotics: Inform the staff if you have a condition or reason that requires you to take antibiotics before dental procedures. ?Pregnancy: If you are pregnant, call and cancel the procedure. ?Sickness: If you have a cold, fever, or any active infections, call and cancel the procedure. ?Arrival: You must be in the facility at least 30 minutes prior to your scheduled procedure. ?Children: Do not bring any  children with you. ?Dress appropriately: Bring dark clothing that you would not mind if they get stained. ?Valuables: Do not bring any jewelry or valuables. ? ?Reasons to call and reschedule or cancel your procedure: (Following these recommendations will minimize the risk of a serious complication.) ?Surgeries: Avoid having procedures within 2 weeks of any surgery. (Avoid for 2 weeks before or after any surgery). ?Flu Shots: Avoid having procedures within 2 weeks of a flu shots or . (Avoid for 2 weeks before or after immunizations). ?Barium: Avoid having a procedure within 7-10 days after having had a radiological study involving the use of radiological contrast. (Myelograms, Barium swallow or enema study). ?Heart attacks: Avoid any elective procedures or surgeries for the initial 6 months after a "Myocardial Infarction" (Heart Attack). ?Blood thinners: It is imperative that you stop these medications before procedures. Let us know if you if you take any blood thinner.  ?Infection: Avoid procedures during or within two weeks of an infection (including chest colds or gastrointestinal problems). Symptoms associated with infections include: Localized redness, fever, chills, night sweats or profuse sweating, burning sensation when voiding, cough, congestion, stuffiness, runny nose, sore throat, diarrhea, nausea, vomiting, cold or Flu symptoms, recent or current infections. It is specially important if the infection is over the area that we intend to treat. ?Heart and lung problems: Symptoms that may suggest an active cardiopulmonary problem include: cough, chest pain, breathing difficulties or shortness of breath, dizziness, ankle swelling, uncontrolled high or unusually low blood pressure, and/or palpitations. If you are experiencing any of these symptoms, cancel your procedure and contact your primary care physician for an evaluation. ? ?Remember:  ?Regular Business hours are:  ?Monday to Thursday 8:00 AM to  4:00  PM ? ?Provider's Schedule: ?Delano Metz, MD:  ?Procedure days: Tuesday and Thursday 7:30 AM to 4:00 PM ? ?Edward Jolly, MD:  ?Procedure days: Monday and Wednesday 7:30 AM to 4:00 PM ?______________________________________________________________________ ?  ?

## 2021-11-13 DIAGNOSIS — R700 Elevated erythrocyte sedimentation rate: Secondary | ICD-10-CM | POA: Insufficient documentation

## 2021-11-13 DIAGNOSIS — R7982 Elevated C-reactive protein (CRP): Secondary | ICD-10-CM | POA: Insufficient documentation

## 2021-11-17 LAB — 25-HYDROXY VITAMIN D LCMS D2+D3
25-Hydroxy, Vitamin D-2: <1 ng/mL
25-Hydroxy, Vitamin D-3: 16 ng/mL
25-Hydroxy, Vitamin D: 16 ng/mL — ABNORMAL LOW

## 2021-11-17 LAB — COMP. METABOLIC PANEL (12)
AST: 15 IU/L (ref 0–40)
Albumin/Globulin Ratio: 1.4 (ref 1.2–2.2)
Albumin: 3.6 g/dL — ABNORMAL LOW (ref 3.8–4.8)
Alkaline Phosphatase: 99 IU/L (ref 44–121)
BUN/Creatinine Ratio: 27 — ABNORMAL HIGH (ref 10–24)
BUN: 18 mg/dL (ref 8–27)
Bilirubin Total: 0.3 mg/dL (ref 0.0–1.2)
Calcium: 8.8 mg/dL (ref 8.6–10.2)
Chloride: 101 mmol/L (ref 96–106)
Creatinine, Ser: 0.66 mg/dL — ABNORMAL LOW (ref 0.76–1.27)
Globulin, Total: 2.5 g/dL (ref 1.5–4.5)
Glucose: 89 mg/dL (ref 70–99)
Potassium: 4.8 mmol/L (ref 3.5–5.2)
Sodium: 141 mmol/L (ref 134–144)
Total Protein: 6.1 g/dL (ref 6.0–8.5)
eGFR: 103 mL/min/{1.73_m2} (ref >59)

## 2021-11-17 LAB — SEDIMENTATION RATE: Sed Rate: 31 mm/hr — ABNORMAL HIGH (ref 0–30)

## 2021-11-17 LAB — MAGNESIUM: Magnesium: 1.8 mg/dL (ref 1.6–2.3)

## 2021-11-17 LAB — C-REACTIVE PROTEIN: CRP: 11 mg/L — ABNORMAL HIGH (ref 0–10)

## 2021-11-17 LAB — VITAMIN B12: Vitamin B-12: 311 pg/mL (ref 232–1245)

## 2021-11-18 LAB — COMPLIANCE DRUG ANALYSIS, UR

## 2021-11-24 ENCOUNTER — Ambulatory Visit
Admission: RE | Admit: 2021-11-24 | Discharge: 2021-11-24 | Disposition: A | Discharge status: Home / Self Care | Payer: Medicare HMO | Attending: Pain Medicine | Admitting: Pain Medicine

## 2021-11-24 ENCOUNTER — Ambulatory Visit
Admission: RE | Admit: 2021-11-24 | Discharge: 2021-11-24 | Disposition: A | Discharge status: Home / Self Care | Payer: Medicare HMO | Source: Ambulatory Visit | Attending: Pain Medicine | Admitting: Pain Medicine

## 2021-11-24 DIAGNOSIS — M5442 Lumbago with sciatica, left side: Secondary | ICD-10-CM | POA: Insufficient documentation

## 2021-11-24 DIAGNOSIS — G8929 Other chronic pain: Secondary | ICD-10-CM | POA: Insufficient documentation

## 2021-11-24 DIAGNOSIS — M5441 Lumbago with sciatica, right side: Secondary | ICD-10-CM | POA: Insufficient documentation

## 2021-11-24 DIAGNOSIS — M79604 Pain in right leg: Secondary | ICD-10-CM | POA: Diagnosis present

## 2021-11-24 DIAGNOSIS — M79605 Pain in left leg: Secondary | ICD-10-CM | POA: Insufficient documentation

## 2021-11-24 DIAGNOSIS — M170 Bilateral primary osteoarthritis of knee: Secondary | ICD-10-CM | POA: Insufficient documentation

## 2022-01-28 DIAGNOSIS — Z23 Encounter for immunization: Secondary | ICD-10-CM

## 2022-02-12 ENCOUNTER — Encounter: Payer: Self-pay | Admitting: Pain Medicine

## 2022-02-12 DIAGNOSIS — Z79891 Long term (current) use of opiate analgesic: Secondary | ICD-10-CM | POA: Insufficient documentation

## 2022-02-12 DIAGNOSIS — Z79899 Other long term (current) drug therapy: Secondary | ICD-10-CM | POA: Insufficient documentation

## 2022-02-12 DIAGNOSIS — F1190 Opioid use, unspecified, uncomplicated: Secondary | ICD-10-CM | POA: Insufficient documentation

## 2022-07-18 IMAGING — CT CT HEAD W/O CM
4 series · 17 of 47 positions shown, 19 images · non-contrast
Comparison: None.

CLINICAL DATA: Mental status change, unknown cause

EXAM:
CT HEAD WITHOUT CONTRAST
TECHNIQUE: Contiguous axial images were obtained from the base of the skull
through the vertex without intravenous contrast.

[Series 2: head bone · axial · 0.44mm/px · z∈[-143,-87]mm · 4 of 83 slices shown]
[im 9/83  bone]
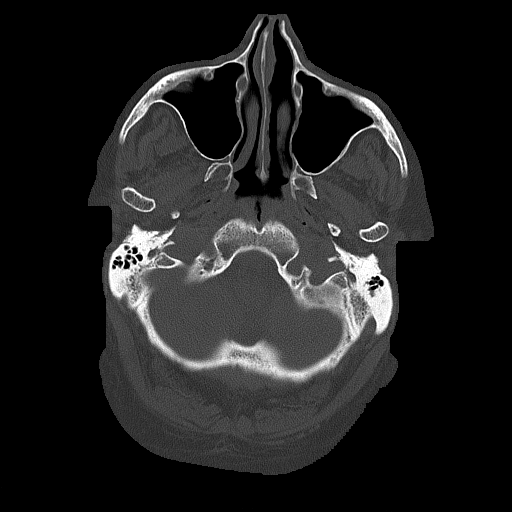
[im 17/83  bone]
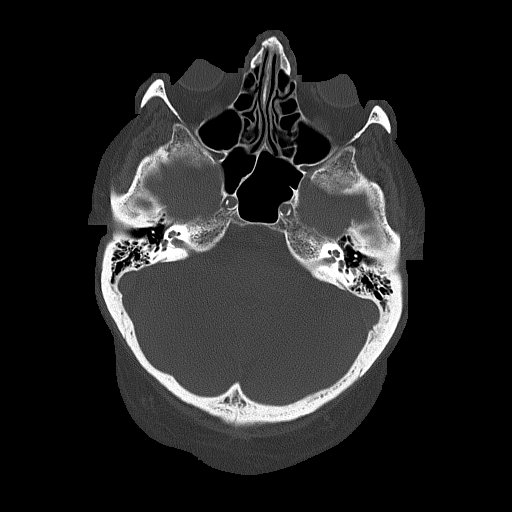
[im 25/83  bone]
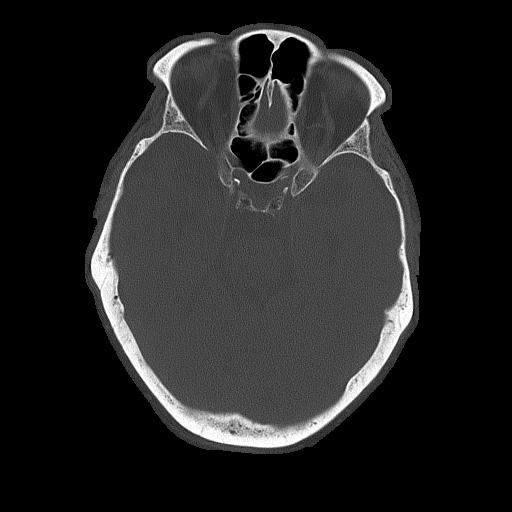
[im 37/83  bone]
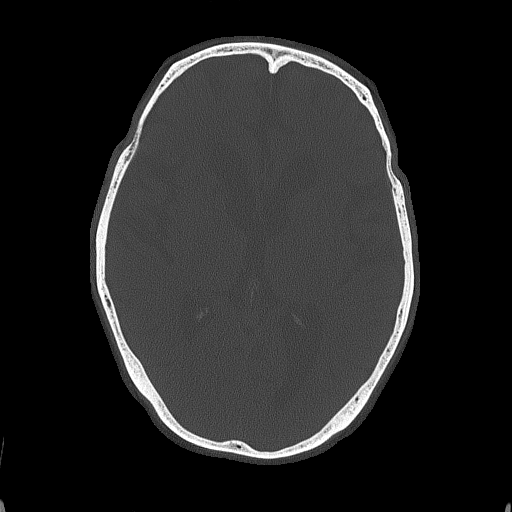

[Series 3: head wo · axial · 0.44mm/px · z∈[-139,-19]mm · 7 of 33 slices shown, 9 images]
[im 5/33  brain]
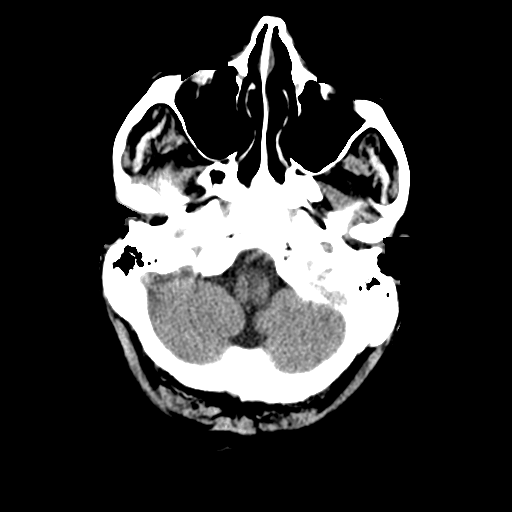
[im 5/33  bone]
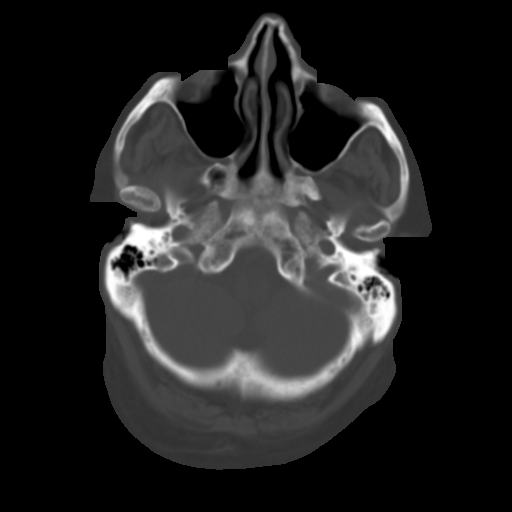
[im 9/33  brain]
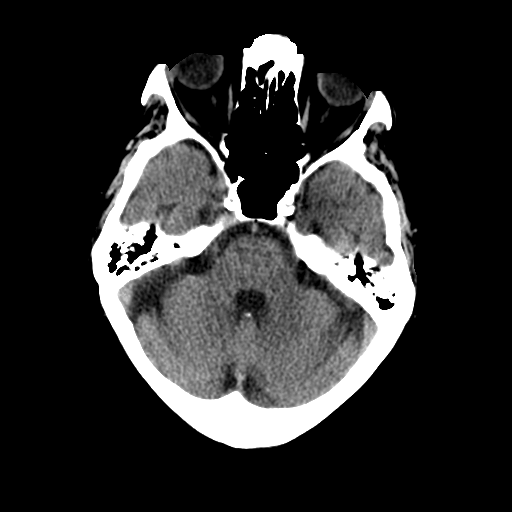
[im 13/33  brain]
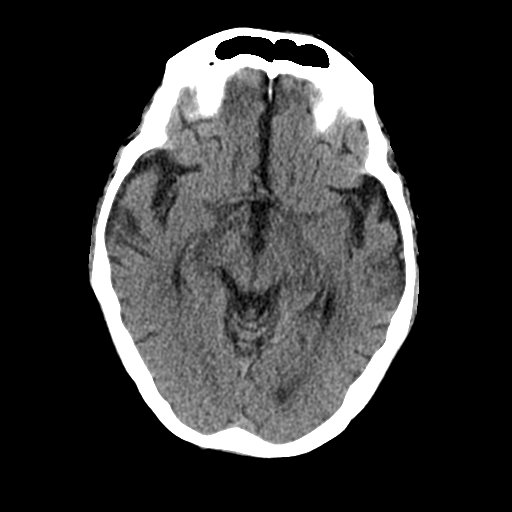
[im 17/33  brain]
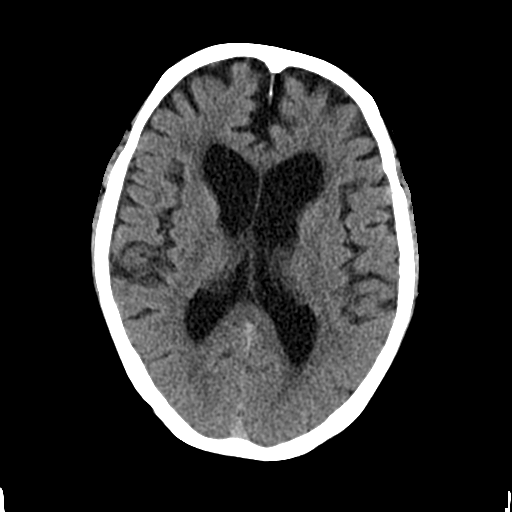
[im 21/33  brain]
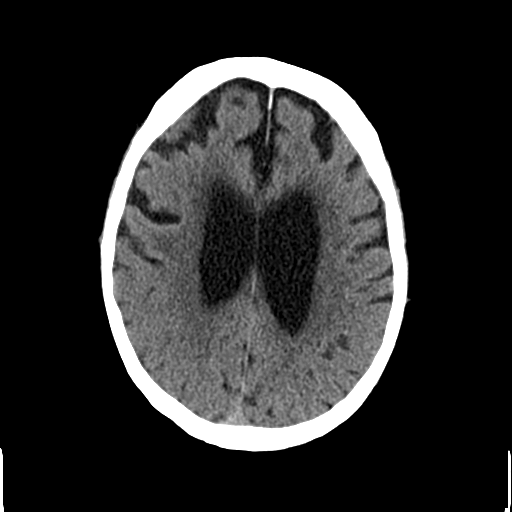
[im 21/33  bone]
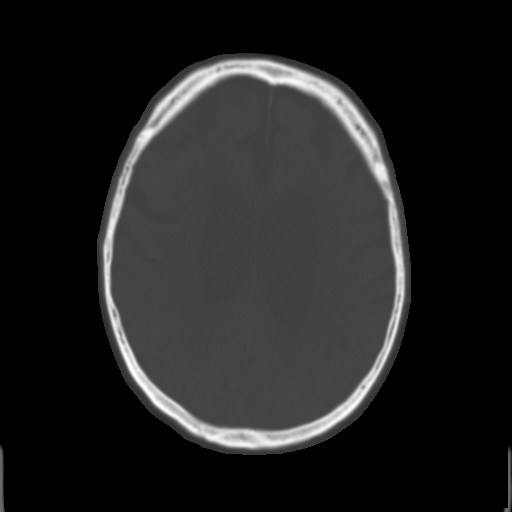
[im 25/33  brain]
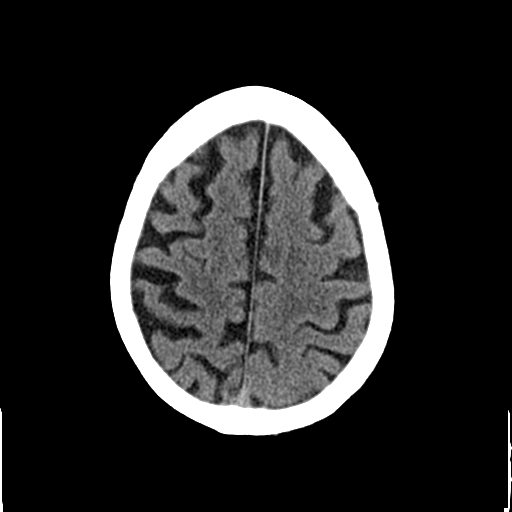
[im 29/33  brain]
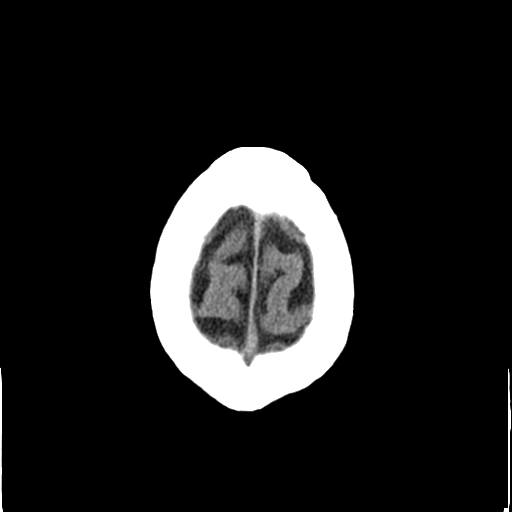

[Series 4: coronal soft tissue · coronal · 0.36mm/px · 3 of 70 slices shown]
[im 24/70  brain]
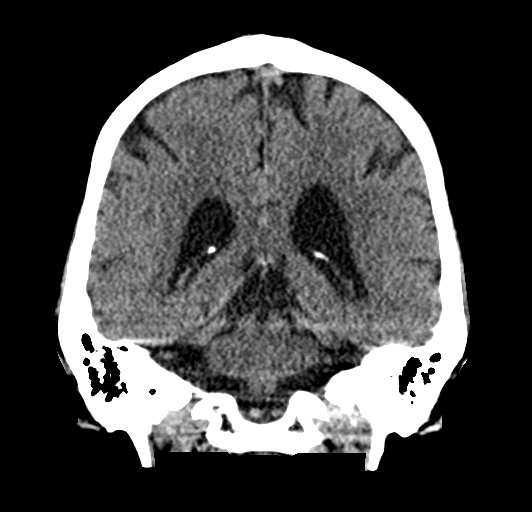
[im 31/70  brain]
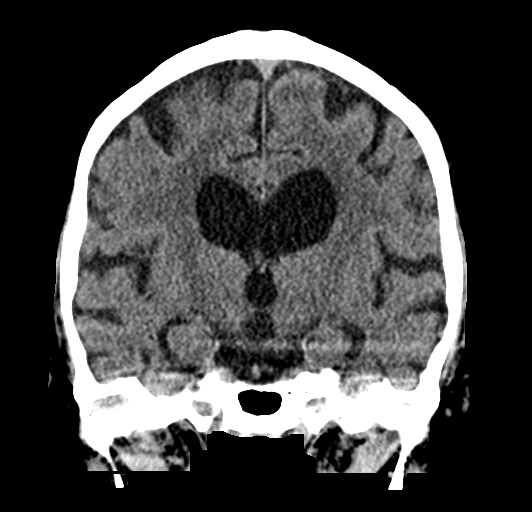
[im 39/70  brain]
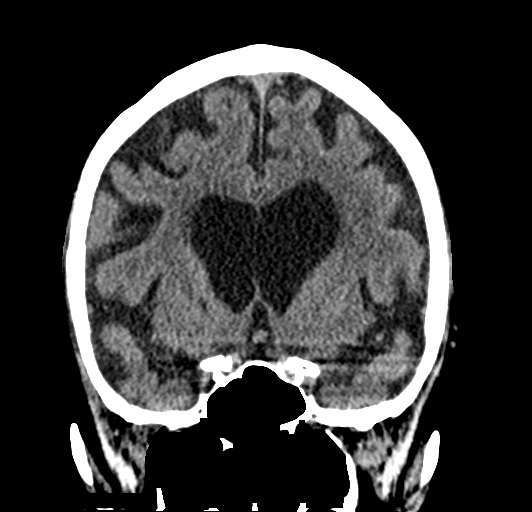

[Series 5: sagittal soft tissue · sagittal · 0.36mm/px · 3 of 64 slices shown]
[im 22/64  brain]
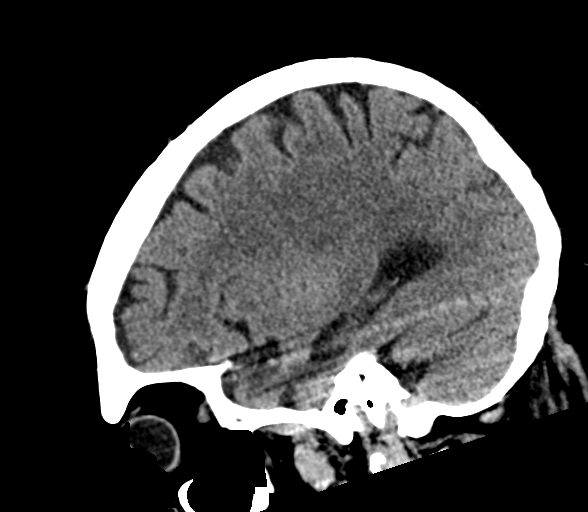
[im 32/64  brain]
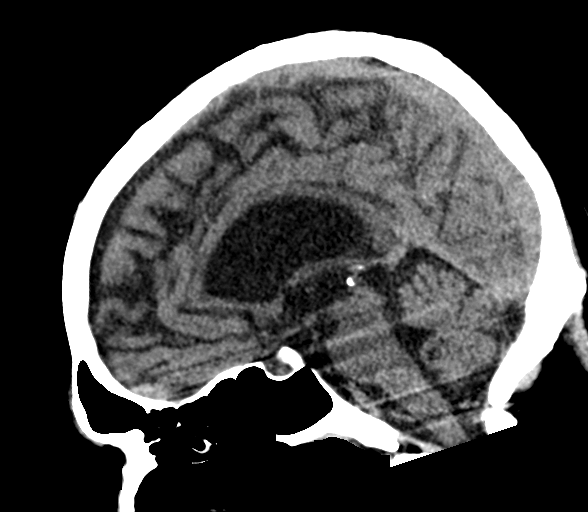
[im 43/64  brain]
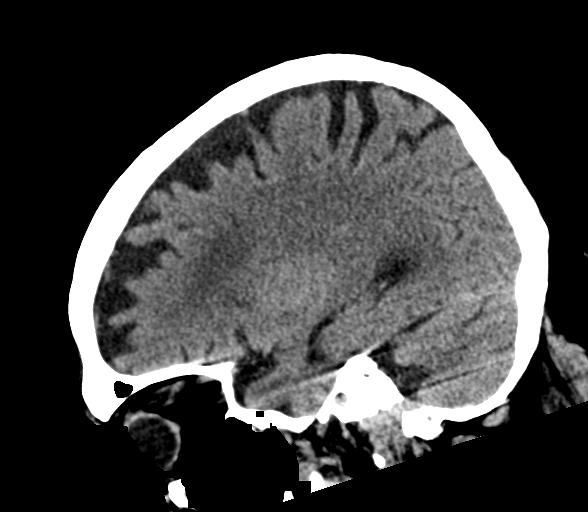

[17 of 47 positions shown; findings below may reference images not displayed]

FINDINGS: Brain: No evidence of acute infarction, hemorrhage, cerebral edema,
mass, mass effect, or midline shift. No extra-axial fluid
collection. Ventriculomegaly with somewhat dilated sylvian fissures.
No definite crowding at the vertex.

Vascular: No hyperdense vessel.

Skull: Normal. Negative for fracture or focal lesion.

Sinuses/Orbits: No acute finding.

Other: The mastoid air cells are well aerated.
IMPRESSION: IMPRESSION
1.  No acute intracranial process.

2. Ventriculomegaly, which may indicate greater than expected
central volume loss for age, but can also be seen in the setting of
normal pressure hydrocephalus, which is a clinical diagnosis.
Correlate with symptoms.

## 2022-07-28 ENCOUNTER — Other Ambulatory Visit: Payer: Self-pay

## 2022-07-28 DIAGNOSIS — Z1211 Encounter for screening for malignant neoplasm of colon: Secondary | ICD-10-CM

## 2022-07-28 MED ORDER — PEG 3350-KCL-NABCB-NACL-NASULF 236 G PO SOLR
4000.0000 mL | Freq: Once | ORAL | 0 refills | Status: AC
Start: 2022-07-28 — End: 2022-07-28

## 2022-07-28 MED ORDER — PEG 3350-KCL-NA BICARB-NACL 420 G PO SOLR: 4000.0000 mL | Freq: Once | ORAL | 0 refills | Status: DC

## 2022-07-28 NOTE — Progress Notes (Unsigned)
Spoke with Charm Rings at [phone] with [facility] to schedule patient  Gastroenterology Pre-Procedure Review  Request Date: 09/14/2022 Requesting Physician: Dr. Allegra Lai  PATIENT REVIEW QUESTIONS: The patient responded to the following health history questions as indicated:    1. Are you having any GI issues? no 2. Do you have a personal history of Polyps? no 3. Do you have a family history of Colon Cancer or Polyps? no 4. Diabetes Mellitus? yes Is not currently taking medications 5. Joint replacements in the past 12 months?no 6. Major health problems in the past 3 months?no 7. Any artificial heart valves, MVP, or defibrillator?no    MEDICATIONS & ALLERGIES:    Patient reports the following regarding taking any anticoagulation/antiplatelet therapy:   Plavix, Coumadin, Eliquis, Xarelto, Lovenox, Pradaxa, Brilinta, or Effient? yes (Eliquis) Aspirin? no  Patient confirms/reports the following medications:  @CMED @ with [facility]  Patient confirms/reports the following allergies:  @ALLERGY @ Verlon Au with [facility]  @ORDERSENC @  AUTHORIZATION INFORMATION Primary Insurance:UHC Medicare  1D#: Group #:  Secondary Insurance: 1D#:Medicaid [place] Group #:  SCHEDULE INFORMATION: Date: 09/14/2022 Time: Location:[facility] with Dr Verlon Au

## 2022-08-14 ENCOUNTER — Telehealth: Payer: Self-pay

## 2022-08-14 NOTE — Telephone Encounter (Signed)
Zenovia Jordan from [facility] called to schedule patient for his colonoscopy.  Informed her that she scheduled his colonoscopy already.  Scheduled with [facility] 07/28/23.  I provided her the date of his procedure and faxed the instructions to her at fax # [phone].  Thanks,  [place], [place]

## 2022-09-14 ENCOUNTER — Encounter: Admission: RE | Payer: Self-pay | Source: Home / Self Care

## 2022-09-14 ENCOUNTER — Encounter: Payer: Self-pay | Admitting: Anesthesiology

## 2022-09-14 ENCOUNTER — Ambulatory Visit: Admission: RE | Admit: 2022-09-14 | Payer: Medicare Other | Source: Home / Self Care | Admitting: Gastroenterology

## 2022-09-14 SURGERY — COLONOSCOPY WITH PROPOFOL
Anesthesia: General

## 2022-09-14 NOTE — Anesthesia Preprocedure Evaluation (Signed)
Anesthesia Evaluation  Patient identified by MRN, date of birth, ID band Patient awake    Reviewed: Allergy & Precautions, H&P , NPO status , Patient's Chart, lab work & pertinent test results, reviewed documented beta blocker date and time   Airway Mallampati: II   Neck ROM: full    Dental  (+) Poor Dentition   Pulmonary sleep apnea and Continuous Positive Airway Pressure Ventilation , COPD, Current Smoker   Pulmonary exam normal        Cardiovascular Exercise Tolerance: Good hypertension, On Medications + CAD  Normal cardiovascular exam Rhythm:regular Rate:Normal     Neuro/Psych   Anxiety Depression Bipolar Disorder    Neuromuscular disease  negative psych ROS   GI/Hepatic ,GERD  Medicated,,(+) Hepatitis -  Endo/Other  diabetes    Renal/GU negative Renal ROS  negative genitourinary   Musculoskeletal   Abdominal   Peds  Hematology  (+) Blood dyscrasia, anemia   Anesthesia Other Findings Past Medical History: No date: A-fib ([place]) No date: AAA (abdominal aortic aneurysm) ([facility]) No date: Anemia No date: COPD (chronic obstructive pulmonary disease) ([facility]) No date: Depression No date: GERD (gastroesophageal reflux disease) No date: Hypertension No date: Spinal stenosis No past surgical history on file.   Reproductive/Obstetrics negative OB ROS                             Anesthesia Physical Anesthesia Plan  ASA: 3  Anesthesia Plan: General   Post-op Pain Management:    Induction:   PONV Risk Score and Plan:   Airway Management Planned:   Additional Equipment:   Intra-op Plan:   Post-operative Plan:   Informed Consent: I have reviewed the patients History and Physical, chart, labs and discussed the procedure including the risks, benefits and alternatives for the proposed anesthesia with the patient or authorized representative who has indicated his/her understanding and  acceptance.     Dental Advisory Given  Plan Discussed with: CRNA  Anesthesia Plan Comments:        Anesthesia Quick Evaluation

## 2022-09-30 ENCOUNTER — Telehealth: Payer: Self-pay | Admitting: Gastroenterology

## 2022-09-30 NOTE — Telephone Encounter (Signed)
Pts aide Particia Jasper called to reschedule colonoscopy would like a call back [phone]

## 2022-09-30 NOTE — Telephone Encounter (Signed)
Returned phone call to Adrian Randall to schedule patients colonoscopy.  He has been scheduled 2 other times for his colonoscopy w/Dr. Marius Ditch. Pt is on Eliquis blood thinner.  Since he is on Eliquis I've informed Magda Paganini that I will call him back to schedule his colonoscopy after we have received Blood Thinner Advice from his physician.  Magda Paganini stated that patient Eliquis is prescribed by Dr. Kellie Simmering at [facility].  Fax # [phone]. Office # [phone]  Thanks, Sharyn Lull, [place]

## 2022-10-01 ENCOUNTER — Other Ambulatory Visit: Payer: Self-pay

## 2022-10-01 DIAGNOSIS — Z1211 Encounter for screening for malignant neoplasm of colon: Secondary | ICD-10-CM

## 2022-10-01 NOTE — Progress Notes (Signed)
Adrian Randall at [facility] called office to request the colonoscopy date so that patients pcp would be able to advise of the stop date.  Informed leslie that I will schedule patient for his /colonoscopy on 10/15/22 at [facility], [place], [place]

## 2022-10-15 ENCOUNTER — Ambulatory Visit
Admission: RE | Admit: 2022-10-15 | Discharge: 2022-10-15 | Disposition: A | Discharge status: Skilled Nursing Facility | Payer: Medicare Other | Attending: Gastroenterology | Admitting: Gastroenterology

## 2022-10-15 ENCOUNTER — Ambulatory Visit: Payer: Medicare Other | Admitting: Registered Nurse

## 2022-10-15 ENCOUNTER — Encounter
Admission: RE | Disposition: A | Discharge status: Skilled Nursing Facility | Payer: Self-pay | Source: Home / Self Care | Attending: Gastroenterology

## 2022-10-15 DIAGNOSIS — Z538 Procedure and treatment not carried out for other reasons: Secondary | ICD-10-CM | POA: Insufficient documentation

## 2022-10-15 DIAGNOSIS — Z1211 Encounter for screening for malignant neoplasm of colon: Secondary | ICD-10-CM | POA: Insufficient documentation

## 2022-10-15 HISTORY — PX: COLONOSCOPY WITH PROPOFOL: SHX5780

## 2022-10-15 SURGERY — COLONOSCOPY WITH PROPOFOL
Anesthesia: General

## 2022-10-15 MED ORDER — SODIUM CHLORIDE 0.9 % IV SOLN: INTRAVENOUS | Status: DC

## 2022-10-15 NOTE — Progress Notes (Signed)
Pt colonoscopy cancelled. Took Eliquis evening before procedure.  Was drinking water in lobby and preop area. Pt not clean, stool was soft to liquid, brown in color.  Will reschedule patient.

## 2022-10-16 ENCOUNTER — Encounter: Payer: Self-pay | Admitting: Gastroenterology

## 2022-10-19 ENCOUNTER — Telehealth: Payer: Self-pay | Admitting: Gastroenterology

## 2022-10-19 NOTE — Telephone Encounter (Signed)
Return [place] call and left a message for call back

## 2022-10-19 NOTE — Telephone Encounter (Signed)
Adrian Randall calling from [employer] [employer] requesting call back [phone]

## 2022-10-20 NOTE — Telephone Encounter (Signed)
Contacted Jasmine at [facility] and I was transferred to her voicemail.  Detailed voice message was left with her in regards to rescheduling patients colonoscopy per Dr. Marius Ditch, "This is the second time we had to reschedule patient's colonoscopy due to not following instructions.  If patient is not alert and oriented as reported by the nursing home staff member, patient's competency in making medical decision on his own needs to be addressed by his primary care provider and a legal guardian or healthcare power of attorney needs to be determined. Also, his PCP can consider noninvasive screening modalities for colon cancer screening based on his overall medical condition I do not recommend to reschedule this patient's colonoscopy until these issues have been addressed."  Thanks, Sharyn Lull, CMA

## 2022-10-20 NOTE — Telephone Encounter (Signed)
This is the second time we had to reschedule patient's colonoscopy due to not following instructions.  If patient is not alert and oriented as reported by the nursing home staff member, patient's competency in making medical decision on his own needs to be addressed by his primary care provider and a legal guardian or healthcare power of attorney needs to be determined. Also, his PCP can consider noninvasive screening modalities for colon cancer screening based on his overall medical condition I do not recommend to reschedule this patient's colonoscopy until these issues have been addressed  Sherri Sear, MD

## 2022-10-20 NOTE — Telephone Encounter (Signed)
Jasmine called this morning and I answered the phone she started stating she has been calling multiply times during the last 3 days and no one has called her back. Informed her the first call I received about this was yesterday at 3:47 and I returned the call at 3:54pm. She states she never received a message from our office. Informed her I call [phone] and the front desk transfer me to her voicemail. She asked if colonoscopy was done. Informed her no because endo nurse Kristen documented   Pt colonoscopy cancelled. Took Eliquis evening before procedure.  Was drinking water in lobby and preop area. Pt not clean, stool was soft to liquid, brown in color.  Will reschedule patient    She states why was this not sent in a letter and sent back with the patient. Informed them she would have to talk to the ENDO unit to find out why. She asked if we were going to reschedule the procedure. Informed her I would transfer her to [place] and she could talk to her about getting the procedure rescheduled. Transferred the call to [facility]

## 2022-10-20 NOTE — Telephone Encounter (Signed)
I spoke with Adrian Randall this morning in regards to rescheduling Adrian Randall's colonoscopy.  Adrian Randall asked how did we know the patient took his Eliquis the evening before procedure.  I said because the physician will ask the patient.  Adrian Randall replied that [name] is not alert and oriented enough to know if he has taken his medications or not.  I responded that if this is the case patient may need a legal guardian to make decisions for him moving forward. I also informed her that Eliquis stop date was supposed to be provided by Dr. Kellie Simmering at [facility], which I faxed the request to [facility] on 10/01/22 and it was never received back however it was discussed with Adrian Randall when I rescheduled the colonoscopy. And although colonoscopy instructions were reviewed with staff member and mailed to [facility] he was still not completely clean.   Please re-advise on rescheduling patients colonoscopy.  Thanks, [place], [place]

## 2022-10-23 IMAGING — CR DG KNEE COMPLETE 4+V*R*
1 series · 4 of 4 positions shown · non-contrast
Comparison: None.

CLINICAL DATA: Right knee pain with arthralgia for 50 years, and
notes pain has increased in the last month.

EXAM:
RIGHT KNEE - COMPLETE 4+ VIEW

[Series 1: dg knee complete 4 views right · 0.14mm/px · 4 of 4 slices shown]
[im 1/4]
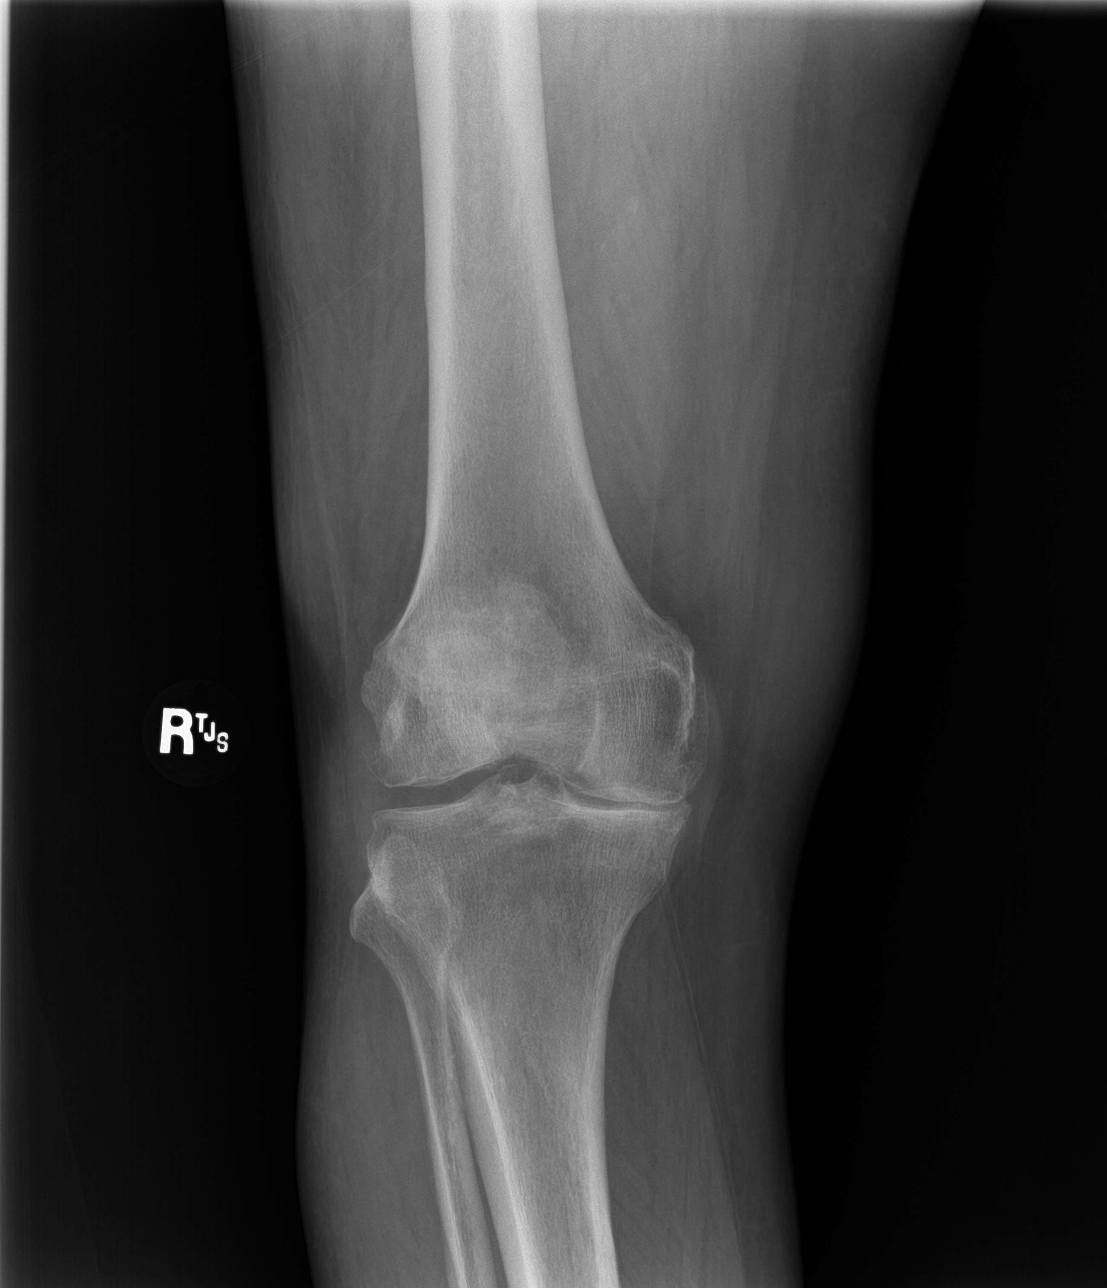
[im 2/4]
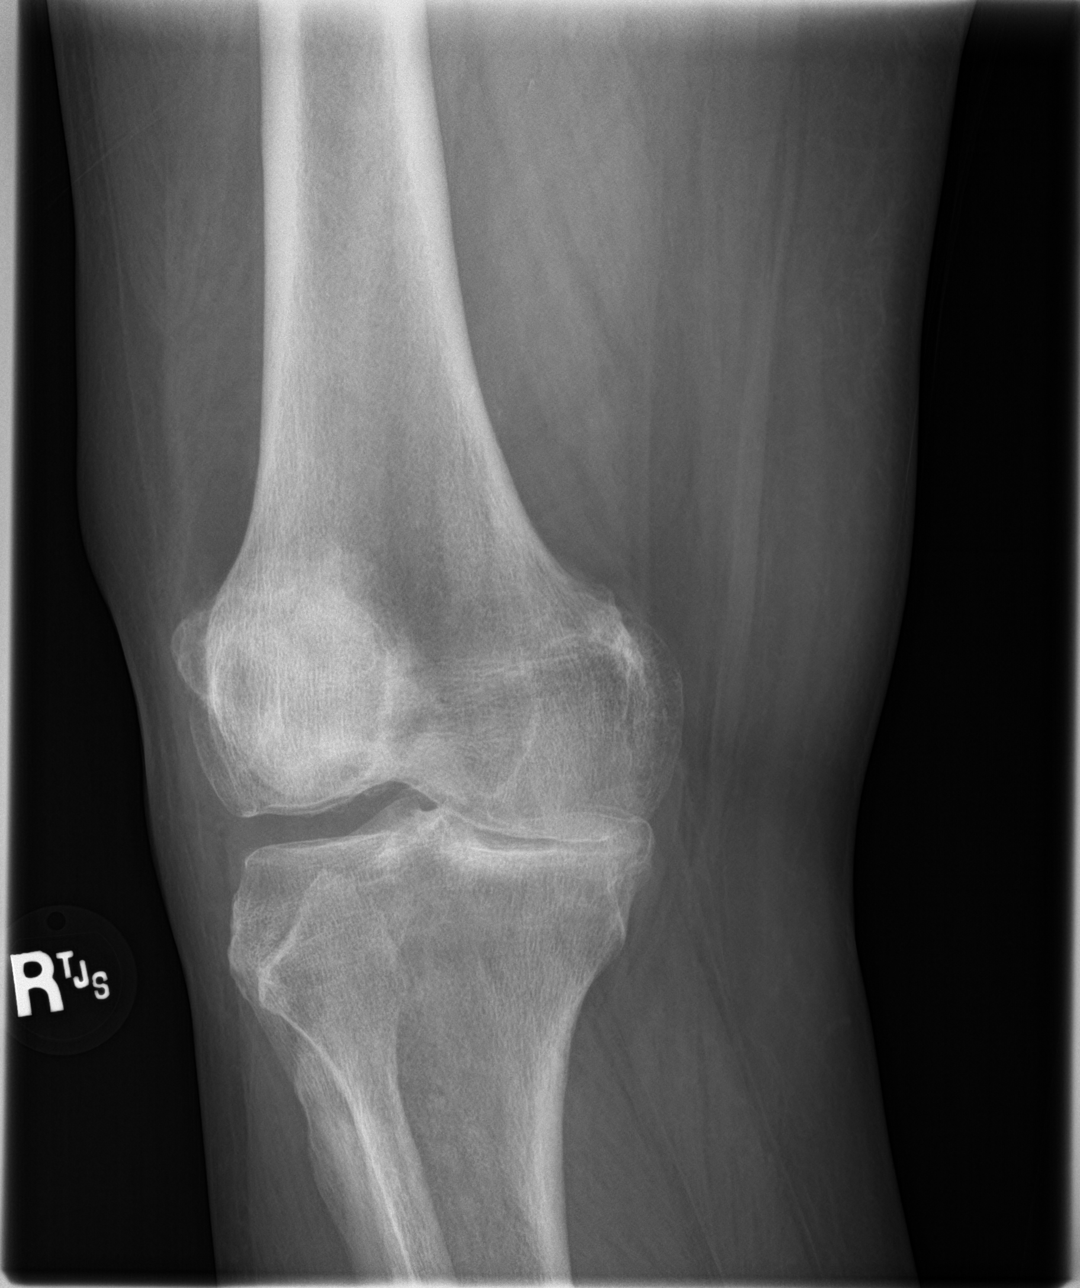
[im 3/4]
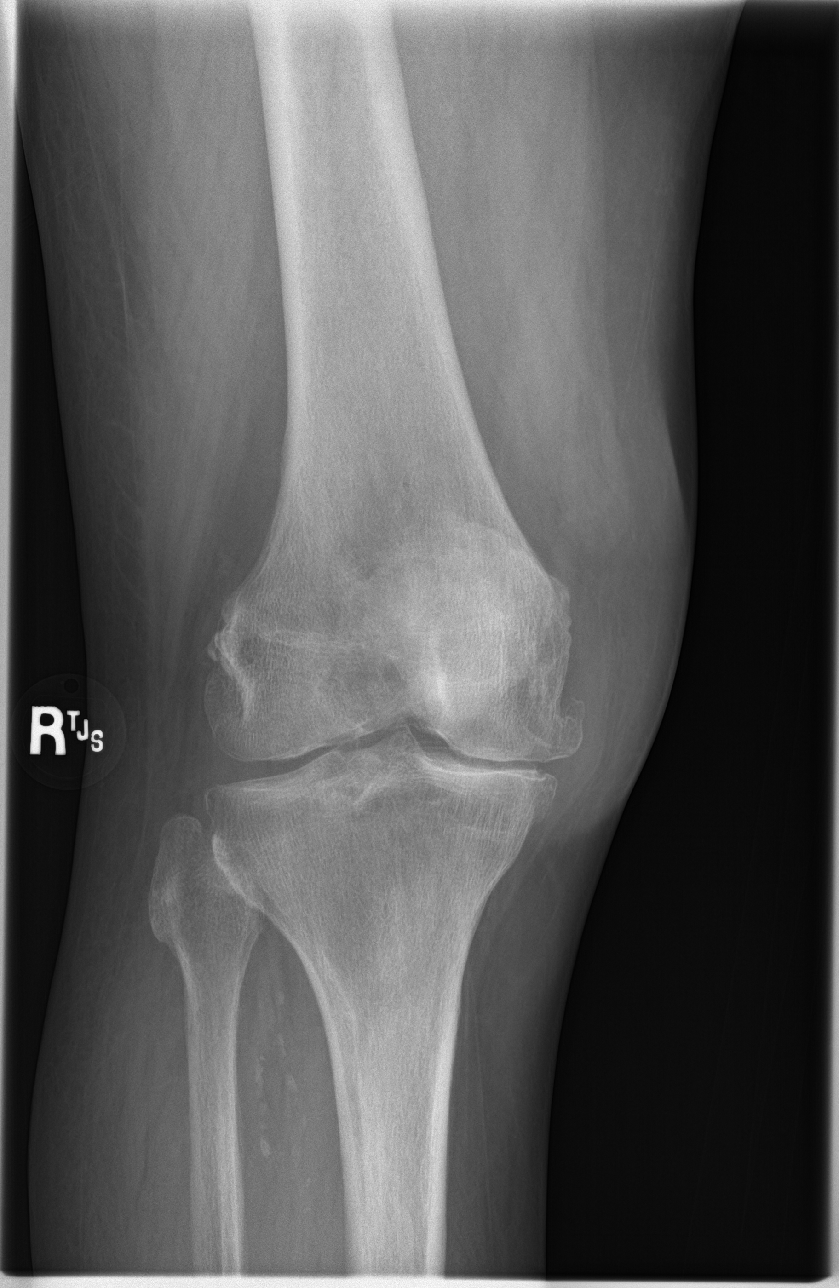
[im 4/4]
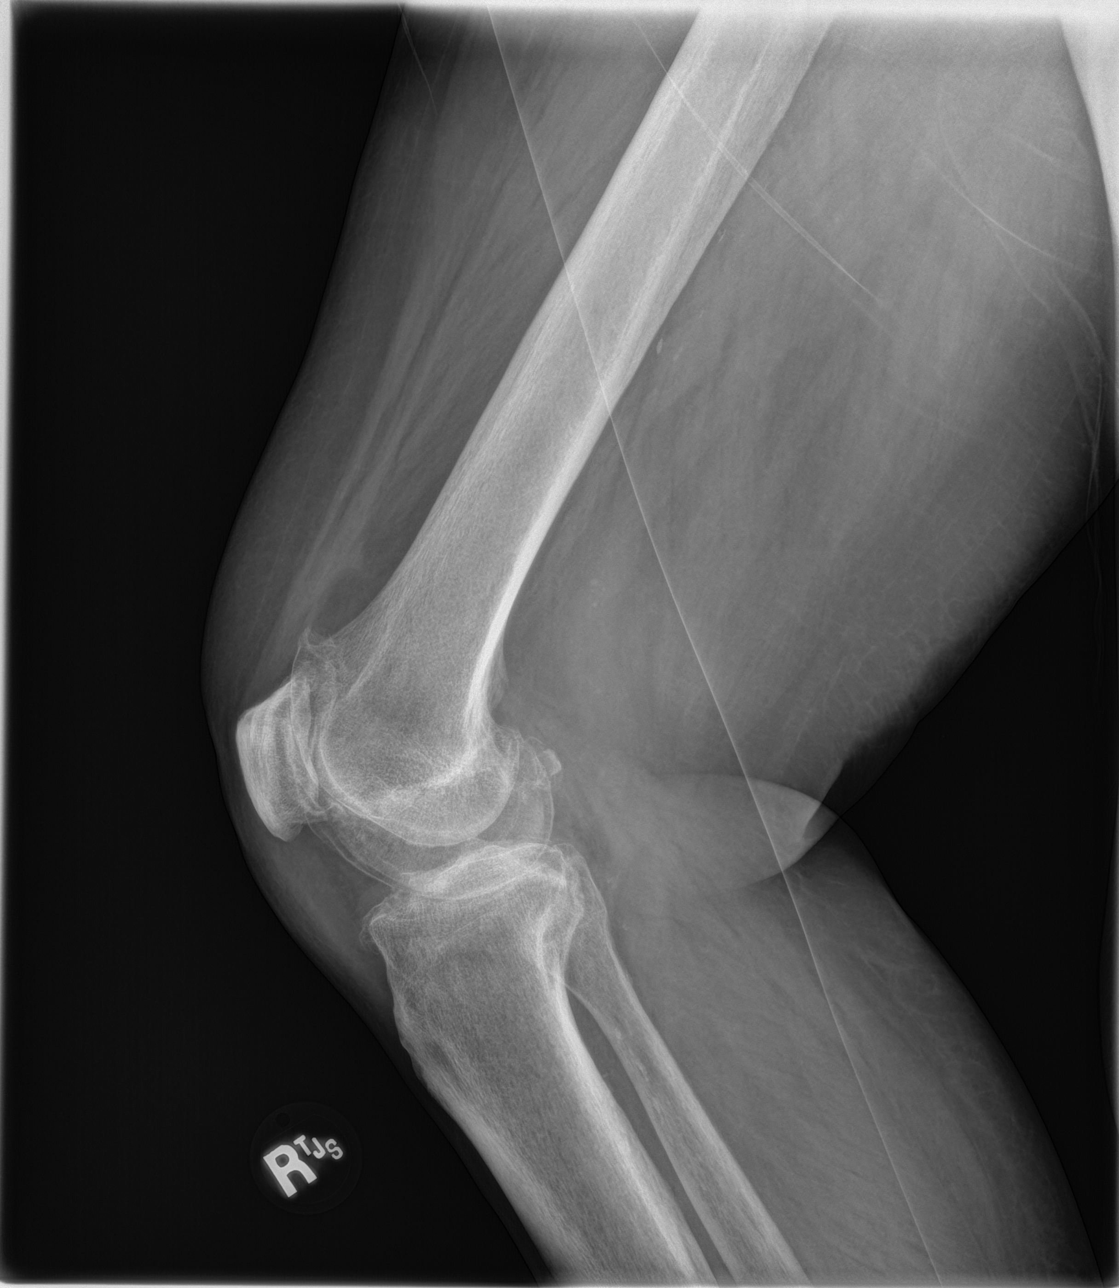

[4 of 4 positions shown; findings below may reference images not displayed]

FINDINGS: Severe medial compartment joint space narrowing and peripheral
osteophytosis. Severe patellofemoral joint space narrowing and
peripheral osteophytosis. No joint effusion. No acute fracture or
dislocation. Mild vascular calcifications.
IMPRESSION: Severe medial and patellofemoral compartment osteoarthritis.
